# Patient Record
Sex: Female | Born: 1960 | Race: White | Hispanic: No | Smoking: Former smoker
Health system: Southern US, Community
[De-identification: ages and names within clinical notes are randomized; demographics above are authoritative.]

## PROBLEM LIST (undated history)

## (undated) DIAGNOSIS — I10 Essential (primary) hypertension: Secondary | ICD-10-CM

## (undated) DIAGNOSIS — F419 Anxiety disorder, unspecified: Secondary | ICD-10-CM

## (undated) DIAGNOSIS — G8929 Other chronic pain: Secondary | ICD-10-CM

## (undated) DIAGNOSIS — M549 Dorsalgia, unspecified: Secondary | ICD-10-CM

## (undated) DIAGNOSIS — M1990 Unspecified osteoarthritis, unspecified site: Secondary | ICD-10-CM

## (undated) HISTORY — DX: Essential (primary) hypertension: I10

## (undated) HISTORY — DX: Anxiety disorder, unspecified: F41.9

## (undated) HISTORY — PX: TONSILLECTOMY: SUR1361

---

## 2000-01-20 HISTORY — PX: CARPAL TUNNEL RELEASE: SHX101

## 2000-05-28 ENCOUNTER — Encounter: Payer: Self-pay | Admitting: Internal Medicine

## 2000-05-28 ENCOUNTER — Ambulatory Visit (HOSPITAL_COMMUNITY): Admission: RE | Admit: 2000-05-28 | Discharge: 2000-05-28 | Payer: Self-pay | Admitting: Internal Medicine

## 2000-06-03 ENCOUNTER — Encounter: Payer: Self-pay | Admitting: Neurosurgery

## 2000-06-03 ENCOUNTER — Encounter: Admission: RE | Admit: 2000-06-03 | Discharge: 2000-06-03 | Payer: Self-pay | Admitting: Neurosurgery

## 2000-06-17 ENCOUNTER — Encounter: Admission: RE | Admit: 2000-06-17 | Discharge: 2000-06-17 | Payer: Self-pay | Admitting: Neurosurgery

## 2000-06-17 ENCOUNTER — Encounter: Payer: Self-pay | Admitting: Neurosurgery

## 2000-07-01 ENCOUNTER — Encounter: Admission: RE | Admit: 2000-07-01 | Discharge: 2000-07-01 | Payer: Self-pay | Admitting: Neurosurgery

## 2000-07-01 ENCOUNTER — Encounter: Payer: Self-pay | Admitting: Neurosurgery

## 2000-07-20 ENCOUNTER — Ambulatory Visit (HOSPITAL_COMMUNITY): Admission: RE | Admit: 2000-07-20 | Discharge: 2000-07-20 | Payer: Self-pay | Admitting: Neurosurgery

## 2000-09-17 ENCOUNTER — Ambulatory Visit (HOSPITAL_COMMUNITY): Admission: RE | Admit: 2000-09-17 | Discharge: 2000-09-17 | Payer: Self-pay | Admitting: Neurosurgery

## 2001-05-27 ENCOUNTER — Emergency Department (HOSPITAL_COMMUNITY): Admission: EM | Admit: 2001-05-27 | Discharge: 2001-05-27 | Payer: Self-pay | Admitting: *Deleted

## 2001-05-27 ENCOUNTER — Encounter: Payer: Self-pay | Admitting: *Deleted

## 2003-01-10 ENCOUNTER — Ambulatory Visit (HOSPITAL_COMMUNITY): Admission: RE | Admit: 2003-01-10 | Discharge: 2003-01-10 | Payer: Self-pay | Admitting: Internal Medicine

## 2003-06-12 ENCOUNTER — Ambulatory Visit (HOSPITAL_COMMUNITY): Admission: RE | Admit: 2003-06-12 | Discharge: 2003-06-12 | Payer: Self-pay | Admitting: Internal Medicine

## 2004-02-06 ENCOUNTER — Ambulatory Visit (HOSPITAL_COMMUNITY): Admission: RE | Admit: 2004-02-06 | Discharge: 2004-02-06 | Payer: Self-pay | Admitting: Internal Medicine

## 2005-02-03 ENCOUNTER — Ambulatory Visit (HOSPITAL_COMMUNITY): Admission: RE | Admit: 2005-02-03 | Discharge: 2005-02-03 | Payer: Self-pay | Admitting: Internal Medicine

## 2005-06-10 ENCOUNTER — Emergency Department (HOSPITAL_COMMUNITY): Admission: EM | Admit: 2005-06-10 | Discharge: 2005-06-10 | Payer: Self-pay | Admitting: Emergency Medicine

## 2006-01-19 HISTORY — PX: BACK SURGERY: SHX140

## 2006-02-05 ENCOUNTER — Ambulatory Visit (HOSPITAL_COMMUNITY): Admission: RE | Admit: 2006-02-05 | Discharge: 2006-02-05 | Payer: Self-pay | Admitting: Internal Medicine

## 2006-05-18 ENCOUNTER — Emergency Department (HOSPITAL_COMMUNITY): Admission: EM | Admit: 2006-05-18 | Discharge: 2006-05-18 | Payer: Self-pay | Admitting: Emergency Medicine

## 2006-05-20 ENCOUNTER — Ambulatory Visit (HOSPITAL_COMMUNITY): Admission: RE | Admit: 2006-05-20 | Discharge: 2006-05-20 | Payer: Self-pay | Admitting: Emergency Medicine

## 2006-09-08 ENCOUNTER — Emergency Department (HOSPITAL_COMMUNITY): Admission: EM | Admit: 2006-09-08 | Discharge: 2006-09-08 | Payer: Self-pay | Admitting: Emergency Medicine

## 2006-11-11 ENCOUNTER — Ambulatory Visit (HOSPITAL_COMMUNITY): Admission: RE | Admit: 2006-11-11 | Discharge: 2006-11-11 | Payer: Self-pay | Admitting: Internal Medicine

## 2006-11-24 ENCOUNTER — Inpatient Hospital Stay (HOSPITAL_COMMUNITY): Admission: RE | Admit: 2006-11-24 | Discharge: 2006-11-27 | Payer: Self-pay | Admitting: Neurosurgery

## 2006-12-20 ENCOUNTER — Encounter (HOSPITAL_COMMUNITY): Admission: RE | Admit: 2006-12-20 | Discharge: 2007-01-19 | Payer: Self-pay | Admitting: Neurosurgery

## 2006-12-30 ENCOUNTER — Encounter: Admission: RE | Admit: 2006-12-30 | Discharge: 2006-12-30 | Payer: Self-pay | Admitting: Neurosurgery

## 2007-01-21 ENCOUNTER — Encounter (HOSPITAL_COMMUNITY): Admission: RE | Admit: 2007-01-21 | Discharge: 2007-02-20 | Payer: Self-pay | Admitting: Neurosurgery

## 2007-03-01 ENCOUNTER — Encounter: Admission: RE | Admit: 2007-03-01 | Discharge: 2007-03-01 | Payer: Self-pay | Admitting: Neurosurgery

## 2007-03-30 ENCOUNTER — Ambulatory Visit (HOSPITAL_COMMUNITY): Admission: RE | Admit: 2007-03-30 | Discharge: 2007-03-30 | Payer: Self-pay | Admitting: Obstetrics & Gynecology

## 2007-04-14 ENCOUNTER — Other Ambulatory Visit: Admission: RE | Admit: 2007-04-14 | Discharge: 2007-04-14 | Payer: Self-pay | Admitting: Obstetrics & Gynecology

## 2007-07-19 ENCOUNTER — Ambulatory Visit (HOSPITAL_COMMUNITY): Admission: RE | Admit: 2007-07-19 | Discharge: 2007-07-19 | Payer: Self-pay | Admitting: Internal Medicine

## 2007-08-05 ENCOUNTER — Encounter (INDEPENDENT_AMBULATORY_CARE_PROVIDER_SITE_OTHER): Payer: Self-pay | Admitting: General Surgery

## 2007-08-05 ENCOUNTER — Ambulatory Visit (HOSPITAL_COMMUNITY): Admission: RE | Admit: 2007-08-05 | Discharge: 2007-08-05 | Payer: Self-pay | Admitting: General Surgery

## 2007-09-01 ENCOUNTER — Encounter: Admission: RE | Admit: 2007-09-01 | Discharge: 2007-09-01 | Payer: Self-pay | Admitting: Neurosurgery

## 2007-12-01 ENCOUNTER — Encounter: Admission: RE | Admit: 2007-12-01 | Discharge: 2007-12-01 | Payer: Self-pay | Admitting: Neurosurgery

## 2008-02-21 ENCOUNTER — Encounter: Admission: RE | Admit: 2008-02-21 | Discharge: 2008-02-21 | Payer: Self-pay | Admitting: Neurosurgery

## 2008-02-28 ENCOUNTER — Encounter: Admission: RE | Admit: 2008-02-28 | Discharge: 2008-02-28 | Payer: Self-pay | Admitting: Neurosurgery

## 2008-03-09 ENCOUNTER — Encounter: Admission: RE | Admit: 2008-03-09 | Discharge: 2008-03-09 | Payer: Self-pay | Admitting: Neurosurgery

## 2008-11-10 IMAGING — CR DG LUMBAR SPINE 1V
1 series · 1 of 1 positions shown · non-contrast
Comparison: GI [REDACTED] lumbar spine radiograph 12/30/06.

CLINICAL DATA: Low back pain post PLIF L3 through L5.  
 LUMBAR SPINE ONE VIEW:

[t l-spine lat]
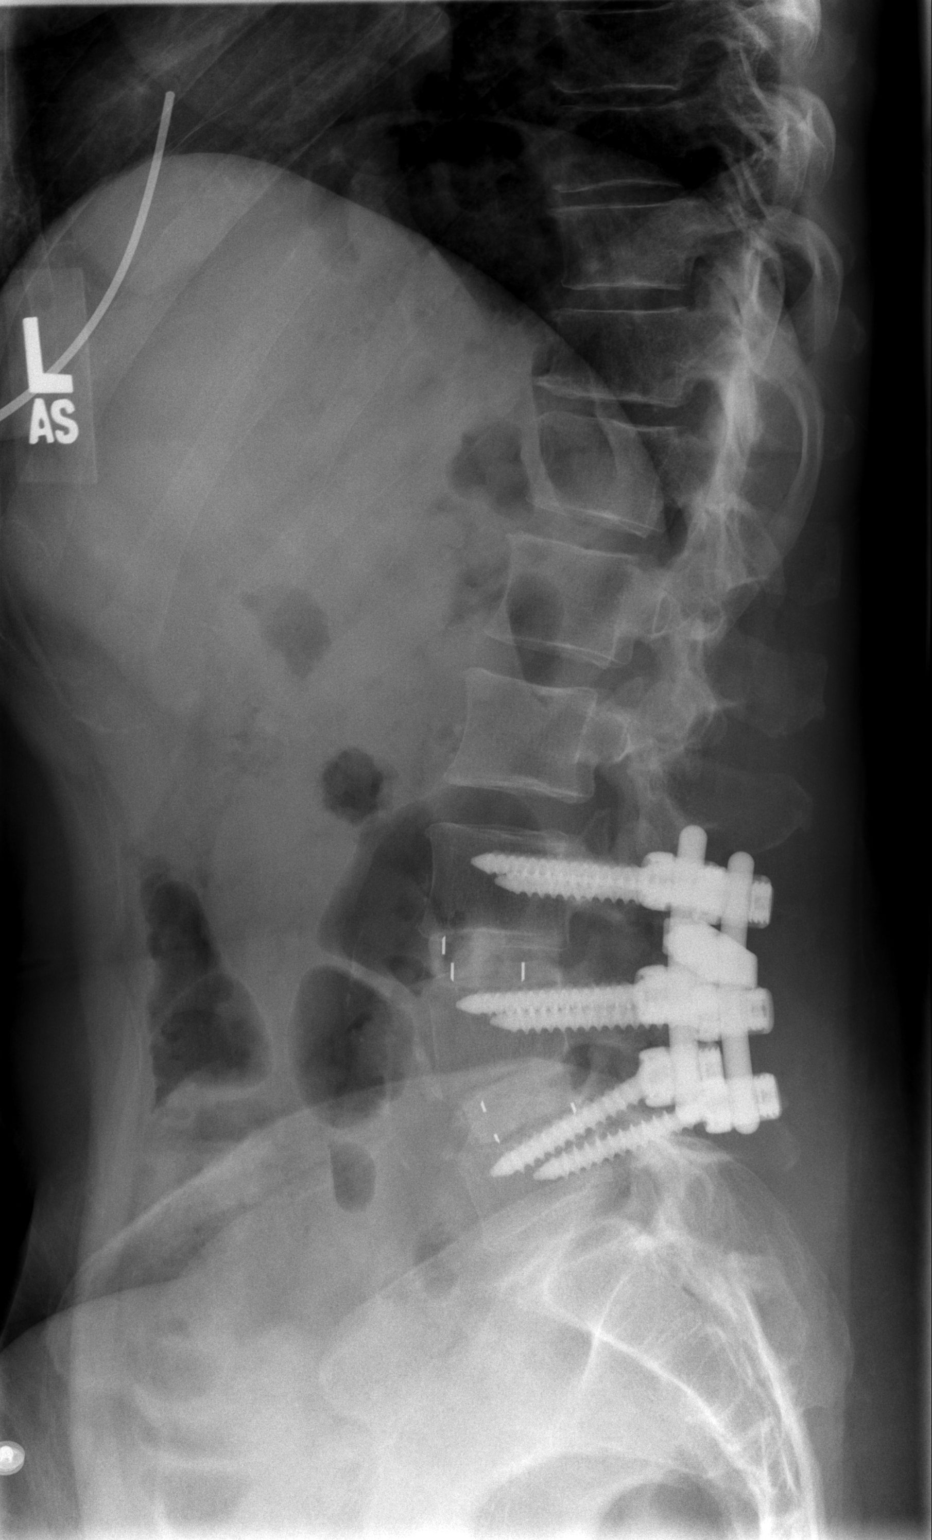

[1 of 1 positions shown; findings below may reference images not displayed]

FINDINGS: Stable satisfactory appearing posterior laminectomy interbody fusion with transpedicular screws from L3 through L5 stable.  Remaining lumbar disc spaces and posterior vertebral alignment are normally maintained.  Unchanged slight atheromatous vascular calcification distal abdominal aorta and bilateral common iliac arteries seen.
IMPRESSION: 1.  No acute findings. 
 2.  Stable satisfactory appearing posterior laminectomy fusion changes L3 through L5.

## 2009-04-16 ENCOUNTER — Other Ambulatory Visit: Admission: RE | Admit: 2009-04-16 | Discharge: 2009-04-16 | Payer: Self-pay | Admitting: Obstetrics & Gynecology

## 2009-04-23 ENCOUNTER — Ambulatory Visit (HOSPITAL_COMMUNITY): Admission: RE | Admit: 2009-04-23 | Discharge: 2009-04-23 | Payer: Self-pay | Admitting: Obstetrics & Gynecology

## 2009-10-11 ENCOUNTER — Ambulatory Visit (HOSPITAL_COMMUNITY): Admission: RE | Admit: 2009-10-11 | Discharge: 2009-10-11 | Payer: Self-pay | Admitting: Neurosurgery

## 2010-02-09 ENCOUNTER — Encounter: Payer: Self-pay | Admitting: Internal Medicine

## 2010-06-03 NOTE — Op Note (Signed)
[name], [name]                 ACCOUNT NO.:  [id number]   MEDICAL RECORD NO.:  [id number]          PATIENT TYPE:  AMB   LOCATION:  DAY                           FACILITY:  [facility]   PHYSICIAN:  Chelsea Primus, MD      DATE OF BIRTH:  January 22, 1960   DATE OF PROCEDURE:  08/05/2007  DATE OF DISCHARGE:                               OPERATIVE REPORT   PREOPERATIVE DIAGNOSIS:  Left posterior arm nodule.   POSTOPERATIVE DIAGNOSIS:  Left posterior arm nodule.   PROCEDURE:  Excision of soft tissue neoplasm of left posterior arm via a  2-cm incision.   SURGEON:  Chelsea Primus, MD   ANESTHESIA:  MAC with local anesthetic.   ESTIMATED BLOOD LOSS:  Minimal.   SPECIMEN:  Left arm subcuticular mass.   INDICATIONS:  The patient is a 50 year old female who presented to my  office with a history of left femoral nodule in the posterior aspect of  her left arm.  Two of these had resolved and one is consistent, and not  significantly changed in size.  She denied any skin changes over this  area, however, this had remained firm and somewhat uncomfortable with  positioning and pressure.  At this point, it was advised that she  undergo an excisional biopsy of this lesion, although the suspicion is  that this is a benign lipomatous lesion.  The risks, benefits, and  alternatives of excision were discussed at length with the patient  including, but not limited to the risk of bleeding, infection, or  recurrence.  The patient's questions and concerns were addressed, and  the patient was consented for the planned procedure.   OPERATION:  The patient was taken to the operating room and placed in  supine position on the operating table, at which time the anesthetic was  administered.  Once the patient was sedated, her left arm was prepped  and draped in the usual fashion.  A vertical incision was created over  the soft tissue mass with a scalpel.  Additional dissection down to the  subcuticular tissue was  carried out using combination of Metzenbaum  scissors dissection as well as electrocautery, and the mass was freed  circumferentially and excised in total, and it was sent as a permanent  specimen to pathology.  Palpation of the wound bed did not demonstrate  any additional thickened tissues or nodularity.  Additional local  anesthetic was instilled.  The wound was irrigated, and a 3-0 Vicryl  suture was utilized to reapproximate the subcuticular tissue, and a 4-0  Monocryl was utilized to reapproximate the skin edges in a running  subcuticular suture.  The skin was washed and dried with moist and dry  towel.  Benzoin was applied over  the incision.  Half-inch Steri-Strips were placed.  The drapes were  removed.  The patient was allowed to come out of sedation.  She was  transferred back to regular hospital bed in stable condition.  At the  conclusion of procedure, all instrument, sponge, and needle counts were  correct.  The patient tolerated the procedure well.  Chelsea Primus, MD  Electronically Signed     BZ/MEDQ  D:  08/06/2007  T:  08/06/2007  Job:  [id number]   cc:   Sherrilee Gilles. Gerarda Fraction, MD  Fax: [phone]

## 2010-06-03 NOTE — Discharge Summary (Signed)
Megan Maldonado, Megan Maldonado                 ACCOUNT NO.:  [id number]   MEDICAL RECORD NO.:  [id number]          PATIENT TYPE:  INP   LOCATION:  3010                         FACILITY:  [facility]   PHYSICIAN:  Kary Kos, M.D.        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   PROCEDURE:  Decompressive laminectomy and posterior lumbar fusion at L3-  L4 and L4-L5.   FINAL DIAGNOSIS:  Decompressive laminectomy and posterior lumbar fusion  at L3-L4 and L4-L5.   HOSPITAL COURSE:  The patient was admitted and he was taken to the  operating room for the aforementioned procedure.  Postop, the patient  did very well and was admitted to the floor.  On the floor, the patient  was significantly improved with improved leg pain and very minimal back  stiffness.  The patient was progressively mobilized with physical and  occupational therapy over the next 24-48 hours.  PCA and Foley were  taken out on day 1.  The patient was stable enough to be discharged home  on hospital day 3 on oral pain medication and muscle relaxers.           ______________________________  Kary Kos, M.D.     GC/MEDQ  D:  12/30/2006  T:  12/30/2006  Job:  [id number]

## 2010-06-03 NOTE — Op Note (Signed)
Megan Maldonado, Megan Maldonado                 ACCOUNT NO.:  [id number]   MEDICAL RECORD NO.:  [id number]          PATIENT TYPE:  INP   LOCATION:  3010                         FACILITY:  [facility]   PHYSICIAN:  Kary Kos, M.D.        DATE OF BIRTH:  03-Sep-1960   DATE OF PROCEDURE:  11/24/2006  DATE OF DISCHARGE:                               OPERATIVE REPORT   PREOPERATIVE DIAGNOSES:  1. Grade 1 spondylolisthesis, L3-4 with severe spinal stenosis,      bilateral L4 radiculopathy.  2. Degenerative disk disease, L4-5 with central disk herniation,      lumbar spinal stenosis and bilateral L5 radiculopathies.   PROCEDURES:  Decompressive laminectomy L3-4 in excess of what would be  needed with a standard interbody fusion, decompressive laminectomy L4-5,  posterior lumbar interbody fusion L3-4 and L4-5 using Hybrid 10 x 22  Telamon PEEK packed with locally harvested autograft mixed with  Progenics bone substitute on one side with a tangent 10 x 26 mm  allograft wedge on the other at both levels.  Pedicle screw fixation, L3  to L5 using the 6.35 Legacy pedicle screw system posterolateral  arthrodesis, L3 to L5 using locally harvested autograft mixed with  Progenics bone substitute, placement of medium Hemovac drain.   SURGEON:  Kary Kos, M.D.   ASSISTANT:  Eustace Moore, MD   ANESTHESIA:  General endotracheal.   HISTORY OF PRESENT ILLNESS:  The patient is a very pleasant 50 year old  female who has had progressively worsening back and bilateral hip and  leg pain getting progressively worse over the last several months.  The  patient has failed all forms of conservative treatment and after  progressively worsening pain with radiation down the backs of her legs  and front of her shins, tops of her feet and big toe, and will  claudicate after very short distances, patient has failed all forms of  conservative treatment.  Preoperative imaging showed grade 1  spondylolisthesis at L3-4, causing severe  spinal stenosis, diastasis of  facet complex at L3-4 as well as severe spinal stenosis at L4-5 with  central disk herniation and facet arthropathy and severe degenerative  disk disease.  Patient was recommended decompressive laminectomies and  fusions at both these levels.  The risks and benefits of the operation  were explained to the patient.  She understands and agreed to proceed  forward.   DESCRIPTION OF PROCEDURE:  The patient was brought to the operating room  where she was induced under general anesthesia.  She was positioned  prone on the Wilson frame, back was prepped and draped in sterile  fashion.  Preoperative x-ray localized the appropriate level.  After  infiltration of 10 mL of lidocaine with epinephrine, midline incision  with Bovie electrocautery was used to take down subcutaneous tissue.  Subperiosteal dissection carried out to lamina of L3, 4 and 5  bilaterally, exposing the transverse processes at L3, 4, and 5  bilaterally.  Intraoperative x-ray confirmed localization at the  appropriate level and then the facet complex was noted to be markedly  hypertrophic and  diastases.  The medial facet complex was drilled down.  Spinous processes at L3 and L4 were removed.  Central decompression was  begun with a 3 mm Kerrison punch.  The thecal sac was noted to be marked  stenotic with hour glass compression of the thecal sac and so gentle  care was taken dissecting the dura off the undersurface of the lamina  with a 4 Penfield and central decompressions were completed with  complete medial facetectomies at 3-4 and 4-5.  Again facet complexes at  L3-4 and 4-5 were noted to be markedly diastased, degenerated and  collapsed.  There was noted to be marked hypertrophic superior aspect of  facets overgrowing on both the L3 and the L4 nerve roots respectively.  After all the facet complexes were completely removed, with removal of  the superior articulating facets at each level.  The  foraminotomies at  L3, L4 and L5 were aggressively performed.  At this point after complete  decompressive laminectomy had been completed and the foramina were  widely patent, skeletonizing both the 3, 4, and 5 roots, attention taken  first to pedicle screw fixation, first using high speed drill pilot  holes were drilled at L3 on the left cannulated with the awl, probed,  tapped with a 5.5 tap, probed again and a 6 x 45 screw inserted on the  left at L5.  Bony landmarks both externally outside the canal as well as  within the canal and fluoroscopy was used to confirm trajectory.  All  pedicles were confirmed and had no medial and lateral breech.  Then the  L4 and L5 screws on the left were inserted in similar fashion with a 6 x  45 at L3 and L4 and a 6 x 40 at L5.  These were repeated on the right  side at L3, 4, and 5 with 6 x 45s at L3 and L4 and 6 x 40 at L5.  After  all six screws had been placed and trajectory confirmed by fluoroscopy,  attention was turned to the interbody work.  Distractors were inserted  at both 3-4 and 4-5 on the right side, 10 distractor at 3-4 and 11  distractor at 4-5.  Then attention was taken to the left side.  Interspace was incised.  The disk space was cleaned out.  Size 10 cutter  was inserted on the left side at L4-5.  The disk space was cleaned out.  Large central disk herniations were removed from the L4-5 with pituitary  rongeurs.  A size 10 chisel was used.  After confirming this to be  appropriate size for graft material and after the end plates were  scraped, a 10 x 22 mm Telamon PEEK cage packed with locally harvested  autograft and allograft was inserted on the left side.  Then the right-  sided distractor was removed and the right-sided interspace was prepared  in similar fashion with a tangent 10 x 26 mm inserted on the patient's  right side.  Then attention taken to 3-4 and in similar fashion the disk  space was cleaned out radically.  Tangent  inserted on the left.  Locally  harvested autograft packed centrally at 3-4 as well as had been done at  4-5 and then on the right side, a Telamon PEEK cage was inserted.  Fluoroscopy confirmed good position of the interbody spacers and then  after all this had been done, the wound was copiously irrigated,  meticulous hemostasis was maintained, aggressive decortication was  carried out at transverse processes and lateral gutters.  The remainder  of the locally harvested autograft pack with Progenics was inserted in  the lateral gutters.  70 mm rods were then placed, top two screws  tightened  down at L5.  The L4 pedicle screws compressed against L5 and  the L3 compressed against L4.  Then a 4 x22 cross link was inserted  between 3 and 4, then the neural foramina were reinspected with a  coronary dilator and angled hockey stick and noted to be widely patent.  Gelfoam was overlaid on top of the dura.  Then a medium Hemovac drain  was placed and wound was closed in layers with interrupted  Vicryl with  Dermabond, Benzoin and Steri-Strips on the skin.  At the end of the  case, needle sponge counts were correct.           ______________________________  Kary Kos, M.D.     GC/MEDQ  D:  11/24/2006  T:  11/25/2006  Job:  [id number]

## 2010-06-06 NOTE — Op Note (Signed)
[facility]  Patient:    Megan Maldonado, Megan Maldonado                        MRN: [id number] Proc. Date: 07/20/00 Adm. Date:  [id number] Disc. Date: [id number] Attending:  Minda Meo                           Operative Report  PREOPERATIVE DIAGNOSIS:  Right carpal tunnel syndrome.  POSTOPERATIVE DIAGNOSIS:  Right carpal tunnel syndrome.  PROCEDURE PERFORMED:  Decompression of the right median nerve.  ANESTHESIA:  Intravenous location plus local.  SURGEON:  Zigmund Daniel. Joya Salm, M.D.  INDICATIONS:  The patient had been complaining of arm pain as well as weakness of the hands.  A neural conduction test was positive for carpal tunnel syndrome.  Surgery was advised.  The patient knew all the risks.  DESCRIPTION OF PROCEDURE:  The patient was taken to the operating room and the right arm was prepped with Betadine.  Infiltration along the base of the thumb was made.  An incision following the base of thumb was made through the skin, volar ligament, and through the carpal ligament.  Decompression proximally was achieved going along the ulnar aspect of the nerve.  Decompression distally was done.  We found that indeed the ligament was thick and the nerve was reddish.  Decompression was achieved.  Hemostasis was done.  At the end of the procedure, the patient was able to the fingers including the thumb. Hemostasis was done with bipolar and the wound was closed with nylon.  The patient tolerated the procedure well. DD:  07/20/00 TD:  07/20/00 Job: [id number]

## 2010-10-17 LAB — BASIC METABOLIC PANEL
BUN: 8
Calcium: 9.8
Creatinine, Ser: 0.59
GFR calc non Af Amer: >60
Glucose, Bld: 93
Potassium: 4.3

## 2010-10-17 LAB — DIFFERENTIAL
Basophils Absolute: 0.1
Eosinophils Relative: 0
Lymphocytes Relative: 32
Lymphs Abs: 2.3
Neutro Abs: 4.4
Neutrophils Relative %: 60

## 2010-10-17 LAB — HCG, SERUM, QUALITATIVE: Preg, Serum: NEGATIVE

## 2010-10-17 LAB — CBC
Platelets: 302
RDW: 13.1
WBC: 7.2

## 2010-10-28 LAB — CBC
HCT: 43
Platelets: 439 — ABNORMAL HIGH
RBC: 4.42
WBC: 10.3

## 2010-10-28 LAB — TYPE AND SCREEN: ABO/RH(D): B NEG

## 2010-10-28 LAB — BASIC METABOLIC PANEL
BUN: 11
Chloride: 106
GFR calc Af Amer: >60
GFR calc non Af Amer: >60
Potassium: 4.3

## 2010-10-28 LAB — ABO/RH: ABO/RH(D): B NEG

## 2011-01-03 IMAGING — MG MM DIGITAL SCREENING
3 series · 3 of 3 positions shown · non-contrast
Comparison: none

DG SCREEN MAMMOGRAM BILATERAL
Bilateral CC and MLO view(s) were taken.

DIGITAL SCREENING MAMMOGRAM WITH CAD:
There are scattered fibroglandular densities.  No masses or malignant type calcifications are 
identified.  Compared with prior studies.
Images were processed with CAD.

[L CC]
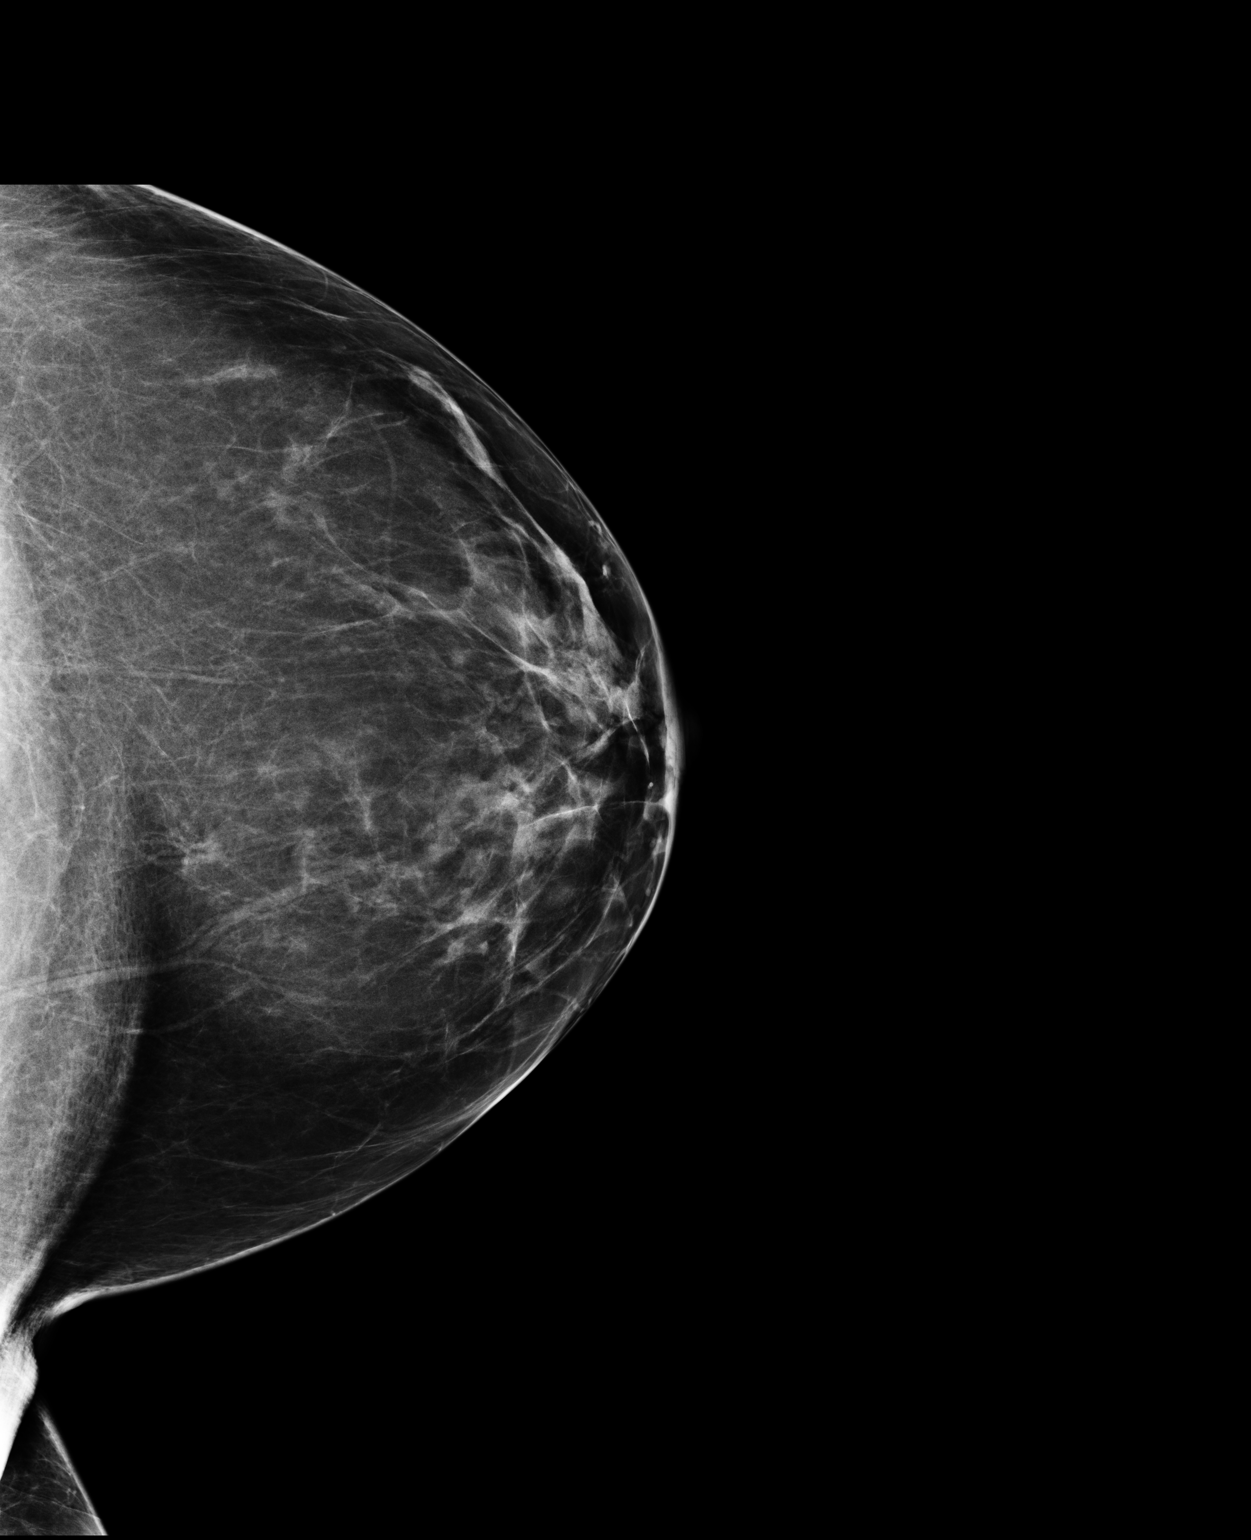

[R CC]
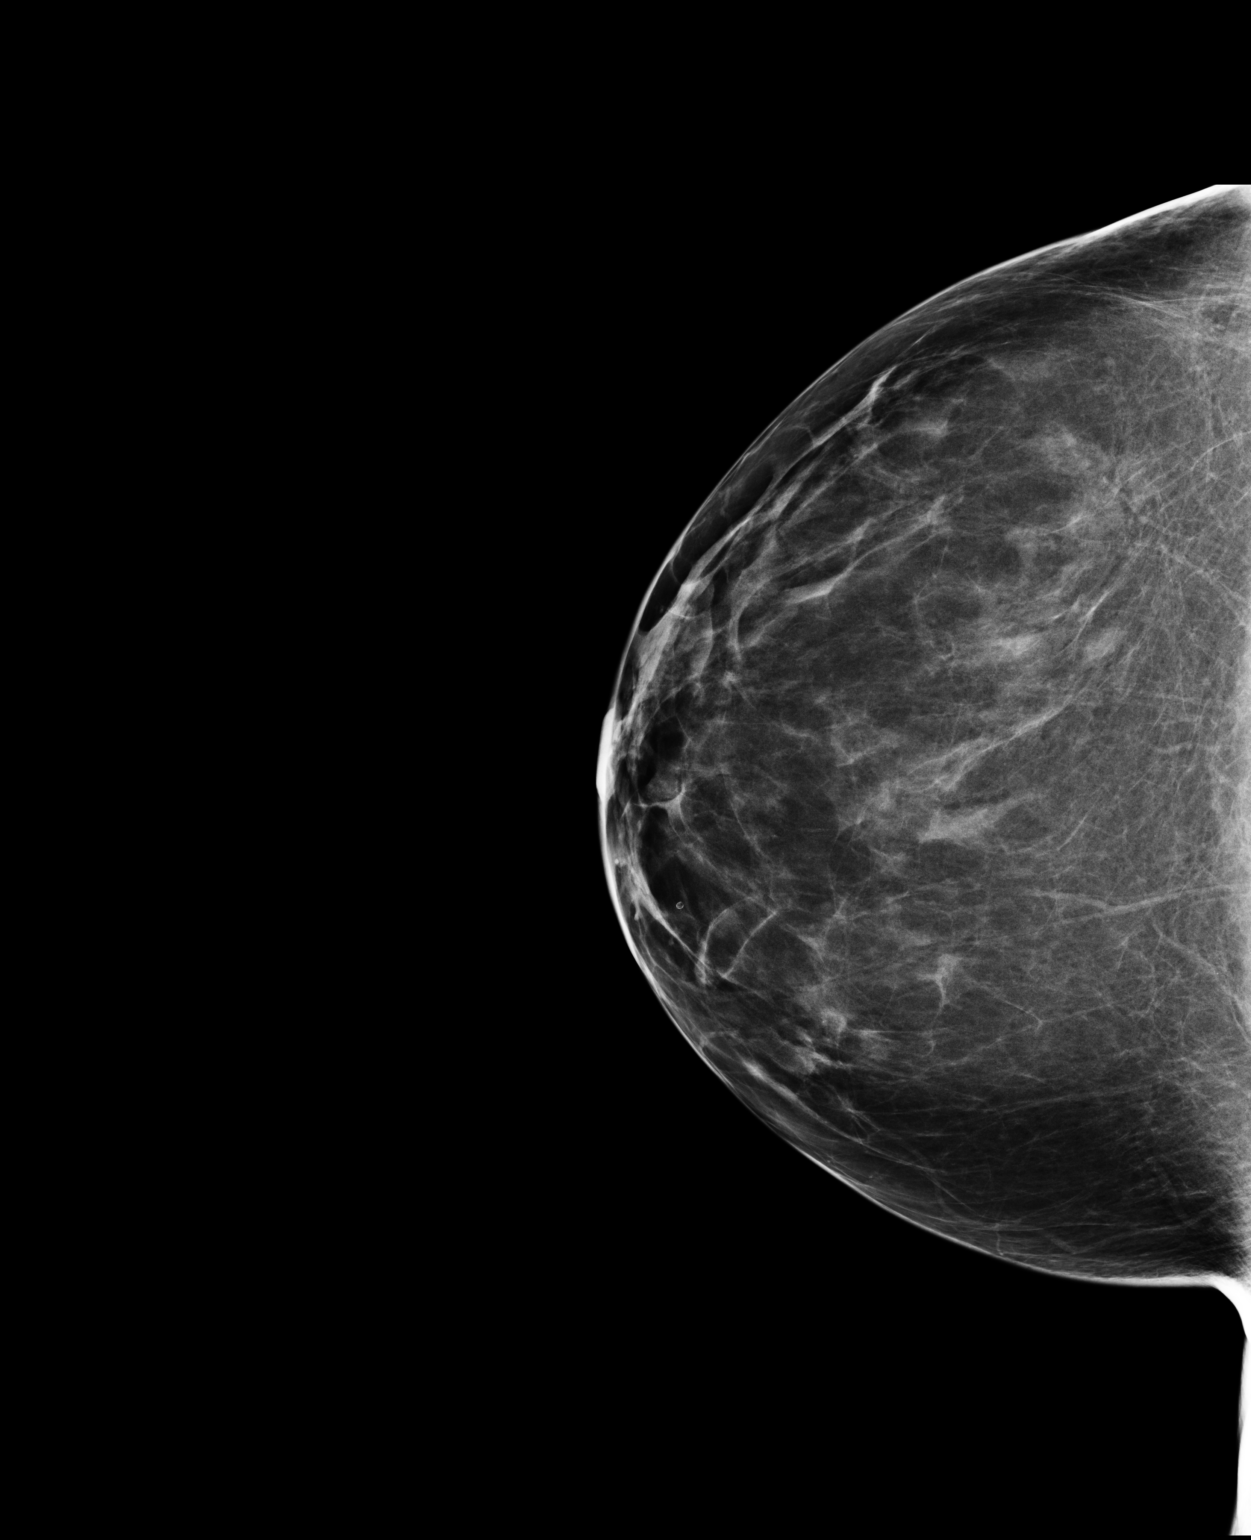

[R MLO]
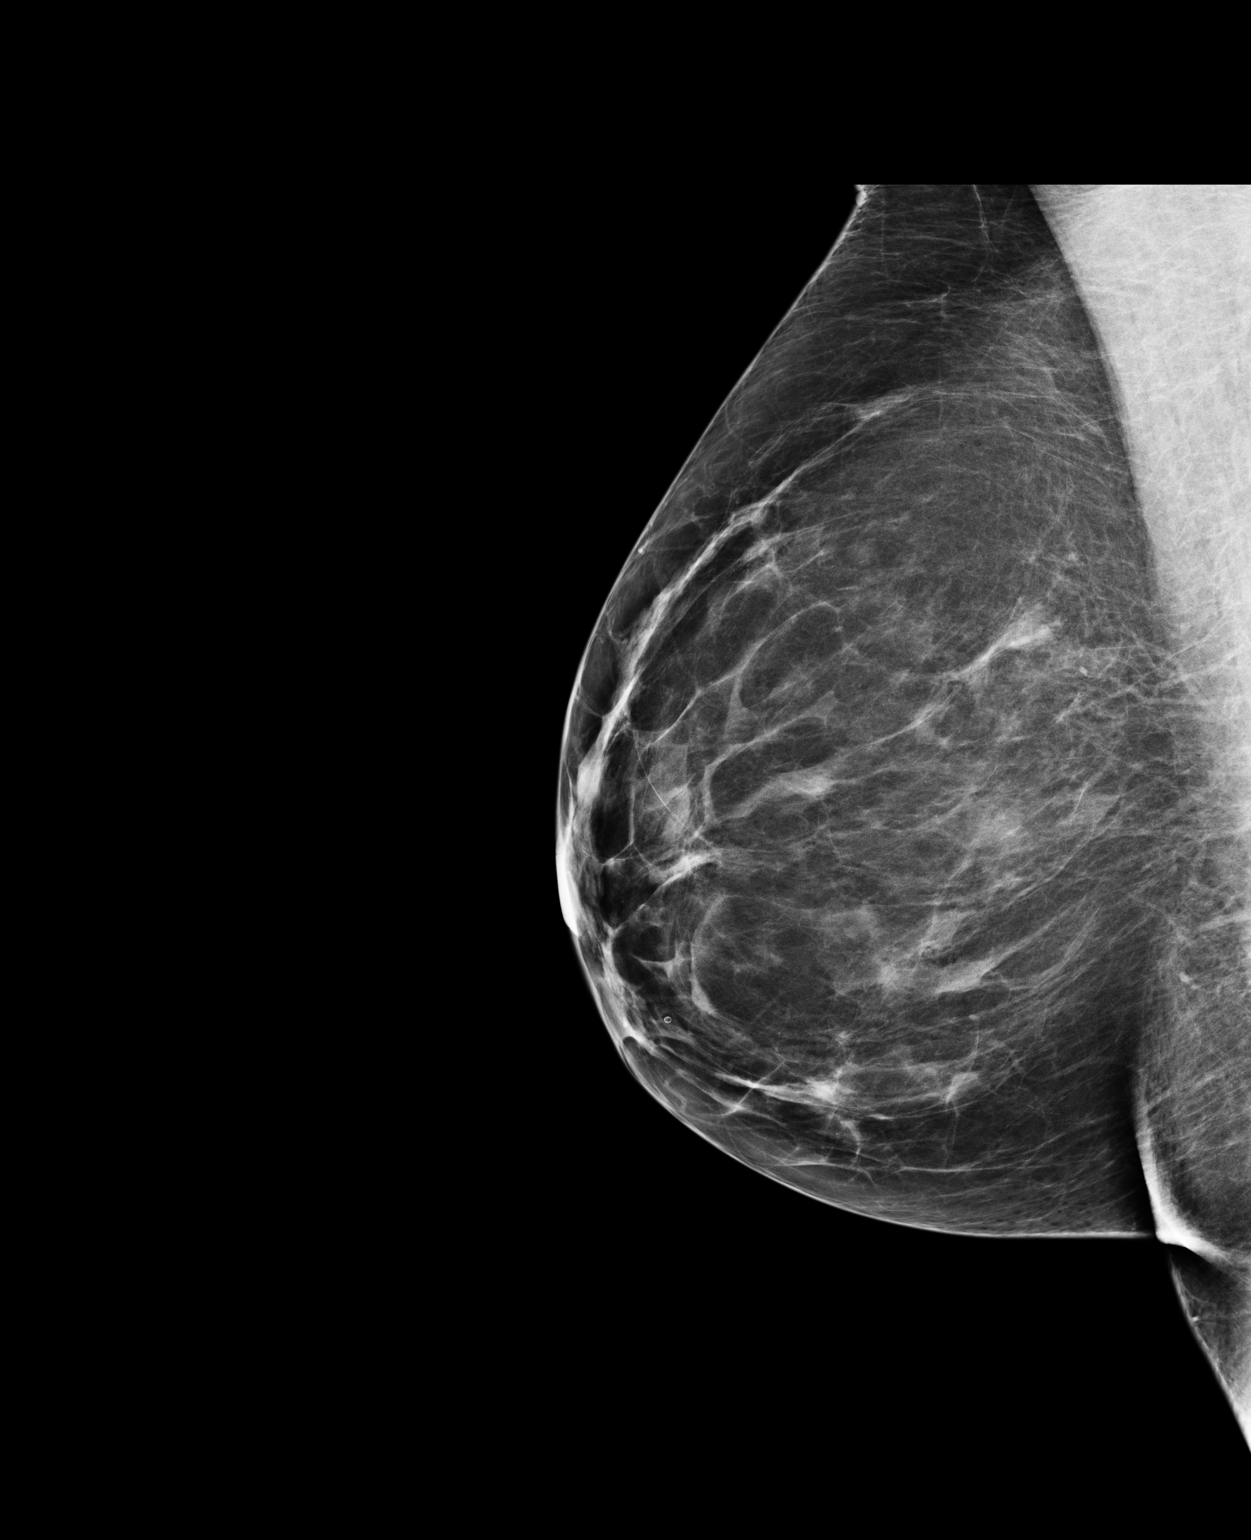

[3 of 3 positions shown; findings below may reference images not displayed]

IMPRESSION: No specific mammographic evidence of malignancy.  Next screening mammogram is recommended in one 
year.

A result letter of this screening mammogram will be mailed directly to the patient.

ASSESSMENT: Negative - BI-RADS 1

Screening mammogram in 1 year.
,

## 2011-06-09 ENCOUNTER — Emergency Department (HOSPITAL_COMMUNITY)
Admission: EM | Admit: 2011-06-09 | Discharge: 2011-06-09 | Disposition: A | Discharge status: Home / Self Care | Payer: 59 | Attending: Emergency Medicine | Admitting: Emergency Medicine

## 2011-06-09 ENCOUNTER — Encounter (HOSPITAL_COMMUNITY): Payer: Self-pay

## 2011-06-09 DIAGNOSIS — Y93H2 Activity, gardening and landscaping: Secondary | ICD-10-CM | POA: Insufficient documentation

## 2011-06-09 DIAGNOSIS — W57XXXA Bitten or stung by nonvenomous insect and other nonvenomous arthropods, initial encounter: Secondary | ICD-10-CM | POA: Insufficient documentation

## 2011-06-09 DIAGNOSIS — Y92009 Unspecified place in unspecified non-institutional (private) residence as the place of occurrence of the external cause: Secondary | ICD-10-CM | POA: Insufficient documentation

## 2011-06-09 DIAGNOSIS — Y998 Other external cause status: Secondary | ICD-10-CM | POA: Insufficient documentation

## 2011-06-09 DIAGNOSIS — S90569A Insect bite (nonvenomous), unspecified ankle, initial encounter: Secondary | ICD-10-CM | POA: Insufficient documentation

## 2011-06-09 HISTORY — DX: Dorsalgia, unspecified: M54.9

## 2011-06-09 HISTORY — DX: Other chronic pain: G89.29

## 2011-06-09 MED ORDER — FEXOFENADINE HCL 180 MG PO TABS: 180.0000 mg | ORAL_TABLET | Freq: Every day | ORAL | Status: DC

## 2011-06-09 MED ORDER — LORATADINE 10 MG PO TABS
10.0000 mg | ORAL_TABLET | Freq: Once | ORAL | Status: AC
  Administered 2011-06-09: 10 mg via ORAL
  Filled 2011-06-09 (×2): qty 1

## 2011-06-09 MED ORDER — PREDNISONE 20 MG PO TABS
60.0000 mg | ORAL_TABLET | Freq: Once | ORAL | Status: AC
  Administered 2011-06-09: 60 mg via ORAL
  Filled 2011-06-09: qty 3

## 2011-06-09 MED ORDER — SULFAMETHOXAZOLE-TMP DS 800-160 MG PO TABS
1.0000 | ORAL_TABLET | Freq: Once | ORAL | Status: AC
  Administered 2011-06-09: 1 via ORAL
  Filled 2011-06-09: qty 1

## 2011-06-09 MED ORDER — SULFAMETHOXAZOLE-TRIMETHOPRIM 800-160 MG PO TABS: 1.0000 | ORAL_TABLET | Freq: Two times a day (BID) | ORAL | Status: AC

## 2011-06-09 MED ORDER — HYDROXYZINE PAMOATE 25 MG PO CAPS: ORAL_CAPSULE | ORAL | Status: DC

## 2011-06-09 NOTE — ED Notes (Signed)
Was working in yard Friday and thinks she may have gotten into chiggers, multiple small bites to legs, one area has large bruise and is tender.  (on left lower leg)

## 2011-06-09 NOTE — ED Notes (Signed)
Multiple insect bites to legs, Area on lt leg has area of redness and contusion.  Insect bites occurred on Friday.

## 2011-06-09 NOTE — ED Provider Notes (Signed)
Medical screening examination/treatment/procedure(s) were performed by non-physician practitioner and as supervising physician I was immediately available for consultation/collaboration. Rolland Porter, MD, FACEP   Janice Norrie, MD 06/09/11 [phone]

## 2011-06-09 NOTE — Discharge Instructions (Signed)
Insect Bite Mosquitoes, flies, fleas, bedbugs, and other insects can bite. Insect bites are different from insect stings. The bite may be red, puffy (swollen), and itchy for 2 to 4 days. Most bites get better on their own. HOME CARE   Do not scratch the bite.   Keep the bite clean and dry. Wash the bite with soap and water.   Put ice on the bite.   Put ice in a plastic bag.   Place a towel between your skin and the bag.   Leave the ice on for 20 minutes, 4 times a day. Do this for the first 2 to 3 days, or as told by your doctor.   You may use medicated lotions or creams to lessen itching as told by your doctor.   Only take medicines as told by your doctor.   If you are given medicines (antibiotics), take them as told. Finish them even if you start to feel better.  You may need a tetanus shot if:  You cannot remember when you had your last tetanus shot.   You have never had a tetanus shot.   The injury broke your skin.  If you need a tetanus shot and you choose not to have one, you may get tetanus. Sickness from tetanus can be serious. GET HELP RIGHT AWAY IF:   You have more pain, redness, or puffiness.   You see a red line on the skin coming from the bite.   You have a fever.   You have joint pain.   You have a headache or neck pain.   You feel weak.   You have a rash.   You have chest pain, or you are short of breath.   You have belly (abdominal) pain.   You feel sick to your stomach (nauseous) or throw up (vomit).   You feel very tired or sleepy.  MAKE SURE YOU:   Understand these instructions.   Will watch your condition.   Will get help right away if you are not doing well or get worse.  Document Released: 01/03/2000 Document Revised: 12/25/2010 Document Reviewed: 08/06/2010 [facility] Patient Information 2012 [place].

## 2011-06-09 NOTE — ED Provider Notes (Signed)
History     CSN: [id number]  Arrival date & time 06/09/11  1617   First MD Initiated Contact with Patient 06/09/11 1820      Chief Complaint  Patient presents with  . Insect Bite    (Consider location/radiation/quality/duration/timing/severity/associated sxs/prior treatment) HPI Comments: Patient states that on May 17. She was doing some weeding in the back of her yard, when she thinks she may have gotten into" a nest of chiggers". She put medication on the areas of her legs, but the next day noted that one of the areas was approximately the size of a quarter, red, raised, and itched more than the rest of the areas. She became concerned that this may be an infection, and presents to the emergency department for additional evaluation. She has not had fever, she's not had chills , and there's been no vomiting. She has not tried any other topical or oral medications.  The history is provided by the patient.    Past Medical History  Diagnosis Date  . Chronic back pain     Past Surgical History  Procedure Date  . Back surgery   . Tonsillectomy   . Carpal tunnel release     No family history on file.  History  Substance Use Topics  . Smoking status: Current Everyday Smoker    Types: Cigarettes  . Smokeless tobacco: Not on file  . Alcohol Use: No    OB History    Grav Para Term Preterm Abortions TAB SAB Ect Mult Living                  Review of Systems  Constitutional: Negative for activity change.       All ROS Neg except as noted in HPI  HENT: Negative for nosebleeds and neck pain.   Eyes: Negative for photophobia and discharge.  Respiratory: Negative for cough, shortness of breath and wheezing.   Cardiovascular: Negative for chest pain and palpitations.  Gastrointestinal: Negative for abdominal pain and blood in stool.  Genitourinary: Negative for dysuria, frequency and hematuria.  Musculoskeletal: Positive for back pain. Negative for arthralgias.  Skin:  Positive for rash.  Neurological: Negative for dizziness, seizures and speech difficulty.  Psychiatric/Behavioral: Negative for hallucinations and confusion.    Allergies  Other  Home Medications   Current Outpatient Rx  Name Route Sig Dispense Refill  . ACETAMINOPHEN 500 MG PO TABS Oral Take 500-1,000 mg by mouth daily as needed. For pain    . DIAZEPAM 5 MG PO TABS Oral Take 5 mg by mouth daily as needed.    Marland Kitchen HYDROCODONE-ACETAMINOPHEN 5-500 MG PO TABS Oral Take 1 tablet by mouth daily as needed.      BP 152/89  Pulse 109  Temp(Src) 98.6 F (37 C) (Oral)  Resp 20  Ht 5' 2"  (1.575 m)  Wt 176 lb (79.833 kg)  BMI 32.19 kg/m2  SpO2 98%  Physical Exam  Nursing note and vitals reviewed. Constitutional: She is oriented to person, place, and time. She appears well-developed and well-nourished.  Non-toxic appearance.  HENT:  Head: Normocephalic.  Right Ear: Tympanic membrane and external ear normal.  Left Ear: Tympanic membrane and external ear normal.  Eyes: EOM and lids are normal. Pupils are equal, round, and reactive to light.  Neck: Normal range of motion. Neck supple. Carotid bruit is not present.  Cardiovascular: Normal rate, regular rhythm, normal heart sounds, intact distal pulses and normal pulses.   Pulmonary/Chest: Breath sounds normal. No respiratory distress.  Abdominal: Soft. Bowel sounds are normal. There is no tenderness. There is no guarding.  Musculoskeletal: Normal range of motion.       Several small raised areas of the lower legs. 1 quarter size. Area of the lateral portion of the left leg. No red streaking. Present. No drainage present. The area is not hot. Distal pulses are symmetrical.  Lymphadenopathy:       Head (right side): No submandibular adenopathy present.       Head (left side): No submandibular adenopathy present.    She has no cervical adenopathy.  Neurological: She is alert and oriented to person, place, and time. She has normal strength. No  cranial nerve deficit or sensory deficit.  Skin: Skin is warm and dry.  Psychiatric: She has a normal mood and affect. Her speech is normal.    ED Course  Procedures (including critical care time)  Labs Reviewed - No data to display No results found.   No diagnosis found.    MDM  I have reviewed nursing notes, vital signs, and all appropriate lab and imaging results for this patient. Patient will be treated with Bactrim 2 times daily with food, Allegra each morning. For itching, Vistaril. Each evening. For itching.       Lenox Ahr, [place] 06/09/11 1835

## 2011-09-01 ENCOUNTER — Other Ambulatory Visit (HOSPITAL_COMMUNITY): Payer: Self-pay | Admitting: Internal Medicine

## 2011-09-01 DIAGNOSIS — Z139 Encounter for screening, unspecified: Secondary | ICD-10-CM

## 2011-09-04 ENCOUNTER — Ambulatory Visit (HOSPITAL_COMMUNITY): Payer: 59

## 2011-09-07 ENCOUNTER — Ambulatory Visit (HOSPITAL_COMMUNITY)
Admission: RE | Admit: 2011-09-07 | Discharge: 2011-09-07 | Disposition: A | Discharge status: Home / Self Care | Payer: 59 | Source: Ambulatory Visit | Attending: Internal Medicine | Admitting: Internal Medicine

## 2011-09-07 DIAGNOSIS — Z139 Encounter for screening, unspecified: Secondary | ICD-10-CM

## 2011-09-07 DIAGNOSIS — Z1231 Encounter for screening mammogram for malignant neoplasm of breast: Secondary | ICD-10-CM | POA: Insufficient documentation

## 2012-06-23 ENCOUNTER — Encounter (HOSPITAL_COMMUNITY): Payer: 59 | Admitting: Physical Therapy

## 2012-06-27 ENCOUNTER — Encounter (HOSPITAL_COMMUNITY): Payer: 59 | Admitting: Physical Therapy

## 2012-07-05 ENCOUNTER — Ambulatory Visit (HOSPITAL_COMMUNITY)
Admission: RE | Admit: 2012-07-05 | Discharge: 2012-07-05 | Disposition: A | Discharge status: Home / Self Care | Payer: BC Managed Care – PPO | Source: Ambulatory Visit | Attending: Internal Medicine | Admitting: Internal Medicine

## 2012-07-05 DIAGNOSIS — M545 Low back pain, unspecified: Secondary | ICD-10-CM | POA: Insufficient documentation

## 2012-07-05 DIAGNOSIS — IMO0001 Reserved for inherently not codable concepts without codable children: Secondary | ICD-10-CM | POA: Insufficient documentation

## 2013-08-02 ENCOUNTER — Other Ambulatory Visit (HOSPITAL_COMMUNITY): Payer: Self-pay | Admitting: Internal Medicine

## 2013-08-02 DIAGNOSIS — K769 Liver disease, unspecified: Secondary | ICD-10-CM

## 2013-08-02 DIAGNOSIS — R740 Nonspecific elevation of levels of transaminase and lactic acid dehydrogenase [LDH]: Secondary | ICD-10-CM

## 2013-08-02 DIAGNOSIS — R7401 Elevation of levels of liver transaminase levels: Secondary | ICD-10-CM

## 2013-08-02 DIAGNOSIS — R748 Abnormal levels of other serum enzymes: Secondary | ICD-10-CM

## 2013-08-08 ENCOUNTER — Ambulatory Visit (HOSPITAL_COMMUNITY)
Admission: RE | Admit: 2013-08-08 | Discharge: 2013-08-08 | Disposition: A | Discharge status: Home / Self Care | Payer: BC Managed Care – PPO | Source: Ambulatory Visit | Attending: Internal Medicine | Admitting: Internal Medicine

## 2013-08-08 DIAGNOSIS — R7402 Elevation of levels of lactic acid dehydrogenase (LDH): Secondary | ICD-10-CM | POA: Insufficient documentation

## 2013-08-08 DIAGNOSIS — R7401 Elevation of levels of liver transaminase levels: Secondary | ICD-10-CM | POA: Insufficient documentation

## 2013-08-08 DIAGNOSIS — R748 Abnormal levels of other serum enzymes: Secondary | ICD-10-CM

## 2013-08-08 DIAGNOSIS — K769 Liver disease, unspecified: Secondary | ICD-10-CM | POA: Insufficient documentation

## 2013-08-08 DIAGNOSIS — R740 Nonspecific elevation of levels of transaminase and lactic acid dehydrogenase [LDH]: Secondary | ICD-10-CM

## 2013-08-17 ENCOUNTER — Encounter (INDEPENDENT_AMBULATORY_CARE_PROVIDER_SITE_OTHER): Payer: Self-pay | Admitting: *Deleted

## 2013-08-30 ENCOUNTER — Ambulatory Visit (INDEPENDENT_AMBULATORY_CARE_PROVIDER_SITE_OTHER): Payer: BC Managed Care – PPO | Admitting: Internal Medicine

## 2013-09-20 ENCOUNTER — Other Ambulatory Visit (HOSPITAL_COMMUNITY): Payer: Self-pay | Admitting: Internal Medicine

## 2013-09-20 DIAGNOSIS — M549 Dorsalgia, unspecified: Secondary | ICD-10-CM

## 2013-09-22 ENCOUNTER — Ambulatory Visit (HOSPITAL_COMMUNITY)
Admission: RE | Admit: 2013-09-22 | Discharge: 2013-09-22 | Disposition: A | Discharge status: Home / Self Care | Payer: BC Managed Care – PPO | Source: Ambulatory Visit | Attending: Internal Medicine | Admitting: Internal Medicine

## 2013-09-22 DIAGNOSIS — R209 Unspecified disturbances of skin sensation: Secondary | ICD-10-CM | POA: Diagnosis not present

## 2013-09-22 DIAGNOSIS — M51379 Other intervertebral disc degeneration, lumbosacral region without mention of lumbar back pain or lower extremity pain: Secondary | ICD-10-CM | POA: Insufficient documentation

## 2013-09-22 DIAGNOSIS — M549 Dorsalgia, unspecified: Secondary | ICD-10-CM

## 2013-09-22 DIAGNOSIS — M545 Low back pain, unspecified: Secondary | ICD-10-CM | POA: Diagnosis present

## 2013-09-22 DIAGNOSIS — M949 Disorder of cartilage, unspecified: Secondary | ICD-10-CM | POA: Diagnosis not present

## 2013-09-22 DIAGNOSIS — M899 Disorder of bone, unspecified: Secondary | ICD-10-CM | POA: Diagnosis not present

## 2013-09-22 DIAGNOSIS — M48061 Spinal stenosis, lumbar region without neurogenic claudication: Secondary | ICD-10-CM | POA: Insufficient documentation

## 2013-09-22 DIAGNOSIS — M5137 Other intervertebral disc degeneration, lumbosacral region: Secondary | ICD-10-CM | POA: Insufficient documentation

## 2013-11-14 DIAGNOSIS — M48061 Spinal stenosis, lumbar region without neurogenic claudication: Secondary | ICD-10-CM | POA: Insufficient documentation

## 2014-10-26 ENCOUNTER — Encounter (HOSPITAL_COMMUNITY): Payer: Self-pay | Admitting: Emergency Medicine

## 2014-10-26 ENCOUNTER — Emergency Department (HOSPITAL_COMMUNITY)
Admission: EM | Admit: 2014-10-26 | Discharge: 2014-10-26 | Disposition: A | Discharge status: Home / Self Care | Payer: BLUE CROSS/BLUE SHIELD | Attending: Emergency Medicine | Admitting: Emergency Medicine

## 2014-10-26 ENCOUNTER — Emergency Department (HOSPITAL_COMMUNITY): Payer: BLUE CROSS/BLUE SHIELD

## 2014-10-26 DIAGNOSIS — Z79899 Other long term (current) drug therapy: Secondary | ICD-10-CM | POA: Insufficient documentation

## 2014-10-26 DIAGNOSIS — G8929 Other chronic pain: Secondary | ICD-10-CM | POA: Diagnosis not present

## 2014-10-26 DIAGNOSIS — M1990 Unspecified osteoarthritis, unspecified site: Secondary | ICD-10-CM | POA: Insufficient documentation

## 2014-10-26 DIAGNOSIS — M25571 Pain in right ankle and joints of right foot: Secondary | ICD-10-CM | POA: Diagnosis present

## 2014-10-26 DIAGNOSIS — Z87828 Personal history of other (healed) physical injury and trauma: Secondary | ICD-10-CM | POA: Diagnosis not present

## 2014-10-26 DIAGNOSIS — Z720 Tobacco use: Secondary | ICD-10-CM | POA: Insufficient documentation

## 2014-10-26 DIAGNOSIS — M79671 Pain in right foot: Secondary | ICD-10-CM

## 2014-10-26 HISTORY — DX: Unspecified osteoarthritis, unspecified site: M19.90

## 2014-10-26 MED ORDER — DICLOFENAC SODIUM 75 MG PO TBEC: 75.0000 mg | DELAYED_RELEASE_TABLET | Freq: Two times a day (BID) | ORAL | Status: DC

## 2014-10-26 NOTE — ED Notes (Signed)
ASO applied. CSM intact.
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
## 2014-11-06 ENCOUNTER — Ambulatory Visit (INDEPENDENT_AMBULATORY_CARE_PROVIDER_SITE_OTHER): Payer: BLUE CROSS/BLUE SHIELD | Admitting: Orthopedic Surgery

## 2014-11-06 VITALS — BP 131/85 | Ht 62.0 in | Wt 200.0 lb

## 2014-11-06 DIAGNOSIS — M129 Arthropathy, unspecified: Secondary | ICD-10-CM

## 2014-11-06 DIAGNOSIS — M19079 Primary osteoarthritis, unspecified ankle and foot: Secondary | ICD-10-CM

## 2014-11-06 MED ORDER — DICLOFENAC SODIUM 75 MG PO TBEC: 75.0000 mg | DELAYED_RELEASE_TABLET | Freq: Two times a day (BID) | ORAL | Status: DC

## 2014-11-06 NOTE — Progress Notes (Signed)
Patient ID: Megan Maldonado, female   DOB: [birth year], 54 y.o.   MRN: [id number]  Chief Complaint  Patient presents with  . Ankle Pain    ER follow up on right ankle pain, no injury.    HPI Megan Maldonado is a 54 y.o. female.  She presents as a 54 year old female who had spontaneous onset of dorsal foot pain. 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 She denies any recent trauma.  She complains of severe sharp dull stabbing aching pain on the top of her right foot in the midtarsal joints associated with swelling and stiffness but relieved by 75 mg of diclofenac twice a day. Symptoms worse with standing in one place  Review of systems fatigue excessive nighttime urination back pain swollen joints stiff joints joint pain food allergy numbness and tingling    Review of Systems Review of Systems  Past Medical History  Diagnosis Date  . Chronic back pain   . Arthritis     Past Surgical History  Procedure Laterality Date  . Back surgery    . Tonsillectomy    . Carpal tunnel release      No family history on file.  Social History Social History  Substance Use Topics  . Smoking status: Current Every Day Smoker    Types: Cigarettes  . Smokeless tobacco: Not on file  . Alcohol Use: No    Allergies  Allergen Reactions  . Other     BLOOD PRESSURE MEDICATION-Starts with an "H"    Current Outpatient Prescriptions  Medication Sig Dispense Refill  . diazepam (VALIUM) 5 MG tablet Take 5 mg by mouth daily as needed.    . diclofenac (VOLTAREN) 75 MG EC tablet Take 1 tablet (75 mg total) by mouth 2 (two) times daily. Take with food 60 tablet 2  . HYDROcodone-acetaminophen (VICODIN) 5-500 MG per tablet Take 1 tablet by mouth daily as needed.    Marland Kitchen losartan (COZAAR) 25 MG tablet Take 25 mg by mouth daily.    Marland Kitchen acetaminophen (TYLENOL EX ST ARTHRITIS PAIN) 500 MG tablet Take 500-1,000 mg by mouth daily as needed. For pain    . fexofenadine (ALLEGRA) 180 MG tablet Take 1 tablet (180  mg total) by mouth daily. 10 tablet 0  . hydrOXYzine (VISTARIL) 25 MG capsule 1 at hs for itching. (Patient not taking: Reported on 11/06/2014) 10 capsule 0   No current facility-administered medications for this visit.       Physical Exam Physical Exam Blood pressure 131/85, height 5' 2"  (1.575 m), weight 200 lb (90.719 kg). Appearance, there are no abnormalities in terms of appearance the patient was well-developed and well-nourished. The grooming and hygiene were normal.  Mental status orientation, there was normal alertness and orientation Mood pleasant Ambulatory status normal with no assistive devices  Examination of the right foot Inspection tenderness and swelling over the tarsometatarsal joints Range of motion ankle range of motion is normal with mild tenderness over the fibula Tests for stability ankle stability intact Motor strength  no atrophy in the foot Skin warm dry and intact without laceration or ulceration or erythema Neurologic examination normal sensation Vascular examination normal pulses with warm extremity and normal capillary refill    Data Reviewed X-ray shows some dystrophic calcification dorsal to the midtarsal joints  Assessment  Encounter Diagnosis  Name Primary?  Marland Kitchen Arthritis of foot Yes      Plan  Meds ordered this encounter  Medications  .       Marland Kitchen  diclofenac (VOLTAREN) 75 MG EC tablet    Sig: Take 1 tablet (75 mg total) by mouth 2 (two) times daily. Take with food    Dispense:  60 tablet    Refill:  2    Follow-up as needed and ice at the end of the day for 20 minutes

## 2014-11-06 NOTE — Addendum Note (Signed)
Addended by: Baldomero Lamy B on: 11/06/2014 03:06 PM   Modules accepted: Orders, Medications

## 2014-11-06 NOTE — Patient Instructions (Addendum)
Apply ice at end of day for 20 minutes  Take medication

## 2014-11-06 NOTE — Addendum Note (Signed)
Addended by: Carole Civil on: 11/06/2014 02:58 PM   Modules accepted: Orders

## 2014-11-12 ENCOUNTER — Telehealth: Payer: Self-pay | Admitting: Orthopedic Surgery

## 2014-11-12 NOTE — Telephone Encounter (Signed)
Patient aware to check with pharmacy, the amount filled does not match amount prescribed

## 2014-11-12 NOTE — Telephone Encounter (Signed)
Returned call, no answer, left vm 

## 2014-11-12 NOTE — Telephone Encounter (Signed)
Patient is calling regarding her medication   diclofenac (VOLTAREN) 75 MG EC tablet , she is under the impression she was to get a 90 day supply with refills but when she picked up the medication is was only #14 with no refills, please advise?

## 2014-11-20 ENCOUNTER — Telehealth: Payer: Self-pay | Admitting: *Deleted

## 2014-11-20 ENCOUNTER — Other Ambulatory Visit: Payer: Self-pay | Admitting: *Deleted

## 2014-11-20 DIAGNOSIS — M19079 Primary osteoarthritis, unspecified ankle and foot: Secondary | ICD-10-CM

## 2014-11-20 MED ORDER — DICLOFENAC SODIUM 75 MG PO TBEC: 75.0000 mg | DELAYED_RELEASE_TABLET | Freq: Two times a day (BID) | ORAL | Status: DC

## 2014-11-20 NOTE — Telephone Encounter (Signed)
Patient called stating pharmacy does not have prescription for diclofenac 75 mg #60 with 2 refills that is showing in our system. She states they only have prescription for diclofenac 75 mg #14 0 refills. [facility] verified that was the only prescription they had, so I sent in the correct one that showed in our system.

## 2017-05-20 DIAGNOSIS — M2550 Pain in unspecified joint: Secondary | ICD-10-CM | POA: Diagnosis not present

## 2017-05-20 DIAGNOSIS — M1991 Primary osteoarthritis, unspecified site: Secondary | ICD-10-CM | POA: Diagnosis not present

## 2017-05-20 DIAGNOSIS — Z1389 Encounter for screening for other disorder: Secondary | ICD-10-CM | POA: Diagnosis not present

## 2017-05-20 DIAGNOSIS — F419 Anxiety disorder, unspecified: Secondary | ICD-10-CM | POA: Diagnosis not present

## 2017-05-20 DIAGNOSIS — E669 Obesity, unspecified: Secondary | ICD-10-CM | POA: Diagnosis not present

## 2017-05-20 DIAGNOSIS — Z6841 Body Mass Index (BMI) 40.0 and over, adult: Secondary | ICD-10-CM | POA: Diagnosis not present

## 2017-05-20 DIAGNOSIS — G894 Chronic pain syndrome: Secondary | ICD-10-CM | POA: Diagnosis not present

## 2017-05-25 ENCOUNTER — Encounter: Payer: Self-pay | Admitting: Internal Medicine

## 2017-08-19 ENCOUNTER — Encounter

## 2017-08-19 ENCOUNTER — Ambulatory Visit: Payer: BLUE CROSS/BLUE SHIELD | Admitting: Gastroenterology

## 2017-08-19 ENCOUNTER — Encounter: Payer: Self-pay | Admitting: Gastroenterology

## 2017-08-19 ENCOUNTER — Telehealth: Payer: Self-pay

## 2017-08-19 ENCOUNTER — Other Ambulatory Visit: Payer: Self-pay

## 2017-08-19 VITALS — BP 156/76 | HR 54 | Temp 97.3°F | Ht 62.0 in | Wt 232.8 lb

## 2017-08-19 DIAGNOSIS — K9041 Non-celiac gluten sensitivity: Secondary | ICD-10-CM | POA: Insufficient documentation

## 2017-08-19 DIAGNOSIS — Z1211 Encounter for screening for malignant neoplasm of colon: Secondary | ICD-10-CM | POA: Diagnosis not present

## 2017-08-19 DIAGNOSIS — R945 Abnormal results of liver function studies: Secondary | ICD-10-CM | POA: Diagnosis not present

## 2017-08-19 MED ORDER — CLENPIQ 10-3.5-12 MG-GM -GM/160ML PO SOLN: 1.0000 | Freq: Once | ORAL | 0 refills | Status: AC

## 2017-08-19 NOTE — Assessment & Plan Note (Signed)
57 year old female presenting for first ever screening colonoscopy.  Given use of anxiolytics and pain medication, we will plan for deep sedation.  I have discussed the risks, alternatives, benefits with regards to but not limited to the risk of reaction to medication, bleeding, infection, perforation and the patient is agreeable to proceed. Written consent to be obtained.

## 2017-08-19 NOTE — Progress Notes (Signed)
cc'ed to pcp °

## 2017-08-19 NOTE — Patient Instructions (Signed)
1. Colonoscopy as scheduled. See separate instructions.  2. Discuss with Dr. Gerarda Fraction regarding if you have Celiac disease or gluten allergy. I will try to get a copy of your old lab but it may be impossible due to how long ago it was.

## 2017-08-19 NOTE — Progress Notes (Signed)
Primary Care Physician:  Redmond School, MD  Primary Gastroenterologist:  Garfield Cornea, MD   Chief Complaint  Patient presents with  . other    schedule colonoscopy    HPI:  Megan Maldonado is a 57 y.o. female here to schedule first ever colonoscopy at the request of Dr. Gerarda Fraction.  Generally she has been feeling well from a GI standpoint. No problems with bowel movements. No melena, brbpr. Occasional heartburn with certain foods. No dysphagia. No abdominal pain.  No unintentional weight loss.  Family history of colon cancer.  Approximately 8 years ago she states she was having all kinds of problems with vomiting, diarrhea.  Dr. Gerarda Fraction did a bunch of blood work and she issues with gluten.  She was told to go on a gluten-free diet.  She did well after starting gluten-free diet.  It is not clear whether she has celiac disease or gluten allergy/sensitivity.    Current Outpatient Medications  Medication Sig Dispense Refill  . diazepam (VALIUM) 5 MG tablet Take 5 mg by mouth daily as needed.    . diclofenac (VOLTAREN) 75 MG EC tablet Take 1 tablet (75 mg total) by mouth 2 (two) times daily. Take with food 60 tablet 2  . HYDROcodone-acetaminophen (VICODIN) 5-500 MG per tablet Take 1 tablet by mouth daily as needed.    Marland Kitchen losartan (COZAAR) 25 MG tablet Take 25 mg by mouth daily.    Marland Kitchen acetaminophen (TYLENOL EX ST ARTHRITIS PAIN) 500 MG tablet Take 500-1,000 mg by mouth daily as needed. For pain    . hydrOXYzine (VISTARIL) 25 MG capsule 1 at hs for itching. (Patient not taking: Reported on 11/06/2014) 10 capsule 0   No current facility-administered medications for this visit.     Allergies as of 08/19/2017 - Review Complete 08/19/2017  Allergen Reaction Noted  . Other  06/09/2011    Past Medical History:  Diagnosis Date  . Anxiety   . Arthritis   . Chronic back pain   . HTN (hypertension)     Past Surgical History:  Procedure Laterality Date  . BACK SURGERY  2008  . CARPAL TUNNEL  RELEASE Bilateral 2002  . TONSILLECTOMY      Family History  Problem Relation Age of Onset  . Pancreatic cancer Maternal Grandmother   . Colon cancer Neg Hx     Social History   Socioeconomic History  . Marital status: Divorced    Spouse name: Not on file  . Number of children: Not on file  . Years of education: Not on file  . Highest education level: Not on file  Occupational History  . Not on file  Social Needs  . Financial resource strain: Not on file  . Food insecurity:    Worry: Not on file    Inability: Not on file  . Transportation needs:    Medical: Not on file    Non-medical: Not on file  Tobacco Use  . Smoking status: Former Smoker    Types: Cigarettes  . Smokeless tobacco: Never Used  . Tobacco comment: Quit smoking x 1 year  Substance and Sexual Activity  . Alcohol use: No  . Drug use: No  . Sexual activity: Yes    Birth control/protection: None  Lifestyle  . Physical activity:    Days per week: Not on file    Minutes per session: Not on file  . Stress: Not on file  Relationships  . Social connections:    Talks on phone: Not on  file    Gets together: Not on file    Attends religious service: Not on file    Active member of club or organization: Not on file    Attends meetings of clubs or organizations: Not on file    Relationship status: Not on file  . Intimate partner violence:    Fear of current or ex partner: Not on file    Emotionally abused: Not on file    Physically abused: Not on file    Forced sexual activity: Not on file  Other Topics Concern  . Not on file  Social History Narrative  . Not on file      ROS:  General: Negative for anorexia, weight loss, fever, chills, fatigue, weakness. Eyes: Negative for vision changes.  ENT: Negative for hoarseness, difficulty swallowing , nasal congestion. CV: Negative for chest pain, angina, palpitations, dyspnea on exertion, peripheral edema.  Respiratory: Negative for dyspnea at rest,  dyspnea on exertion, cough, sputum, wheezing.  GI: See history of present illness. GU:  Negative for dysuria, hematuria, urinary incontinence, urinary frequency, nocturnal urination.  MS: Positive for joint pain, low back pain.  Derm: Negative for rash or itching.  Neuro: Negative for weakness, abnormal sensation, seizure, frequent headaches, memory loss, confusion.  Psych: Negative for anxiety, depression, suicidal ideation, hallucinations.  Endo: Negative for unusual weight change.  Heme: Negative for bruising or bleeding. Allergy: Negative for rash or hives.    Physical Examination:  BP (!) 156/76   Pulse (!) 54   Temp (!) 97.3 F (36.3 C) (Oral)   Ht 5' 2"  (1.575 m)   Wt 232 lb 12.8 oz (105.6 kg)   BMI 42.58 kg/m    General: Well-nourished, well-developed in no acute distress.  Head: Normocephalic, atraumatic.   Eyes: Conjunctiva pink, no icterus. Mouth: Oropharyngeal mucosa moist and pink , no lesions erythema or exudate. Neck: Supple without thyromegaly, masses, or lymphadenopathy.  Lungs: Clear to auscultation bilaterally.  Heart: Regular rate and rhythm, no murmurs rubs or gallops.  Abdomen: Bowel sounds are normal, nontender, nondistended, no hepatosplenomegaly or masses, no abdominal bruits or    hernia , no rebound or guarding.   Rectal: Deferred Extremities: No lower extremity edema. No clubbing or deformities.  Neuro: Alert and oriented x 4 , grossly normal neurologically.  Skin: Warm and dry, no rash or jaundice.   Psych: Alert and cooperative, normal mood and affect.

## 2017-08-19 NOTE — Assessment & Plan Note (Signed)
Unclear if she has true diagnosis of celiac disease.  She is doing well on a gluten-free diet has been on this diet for close years.  At this time serologies and biopsy would not likely be beneficial.  We will try to request old labs for however I cautioned patient that these may no longer be available difficult to obtain.  Just that she discuss further with PCP with her next visit regarding her diagnosis.  Handout regarding celiac disease provided with the patient to educate her on what type of f/u would be indicated if she does have celiac disease. Patient voiced understanding and will address with PCP as well.

## 2017-08-19 NOTE — Telephone Encounter (Signed)
Called and informed pt of pre-op appt 09/30/17 at 1:45pm. Letter mailed.

## 2017-09-13 ENCOUNTER — Telehealth: Payer: Self-pay | Admitting: Gastroenterology

## 2017-09-13 DIAGNOSIS — R7989 Other specified abnormal findings of blood chemistry: Secondary | ICD-10-CM

## 2017-09-13 DIAGNOSIS — K900 Celiac disease: Secondary | ICD-10-CM

## 2017-09-13 DIAGNOSIS — R945 Abnormal results of liver function studies: Secondary | ICD-10-CM

## 2017-09-13 NOTE — Telephone Encounter (Signed)
OK I have ordered labs. Please release. She will need to go to [place].

## 2017-09-13 NOTE — Telephone Encounter (Signed)
Spoke with pt. Pt was notified about lab work and she would like to have the celiac lab work done. Pt would prefer not to add gluten into her diet. Pt is aware that she will also need additional lab work for abnormal blood work.

## 2017-09-13 NOTE — Addendum Note (Signed)
Addended by: Mahala Menghini on: 09/13/2017 10:28 PM   Modules accepted: Orders

## 2017-09-13 NOTE — Telephone Encounter (Signed)
Please let patient know that we were not successful at obtaining old celiac labs from PCP.  We did send her most recent labs however.  August 20, 2017: White blood cell count 7600, hemoglobin 13.6, platelets 335,000, BUN 12, creatinine 0.69, glucose 113, total bilirubin 0.4, alkaline phosphatase 146 high, AST 27, ALT 35 high, cholesterol 218 high, LDL 148 high, TSH 1.150.  As discussed at time of OV. Since she is on gluten free diet, her serologies may not be helpful UNLESS she is consuming some gluten.  We have three choices: check labs and see what they are OR gluten challenge OR treat as celiac. Either way, she needs follow up on abnormal LFTs which could be due to fatty liver, celiac, or other.   LET ME KNOW WHAT SHE DECIDES.

## 2017-09-14 NOTE — Telephone Encounter (Signed)
Lsl, I see lab orders under labs but under order review, order aren't there to be released.

## 2017-09-14 NOTE — Telephone Encounter (Signed)
Noted. Labs were printed off and mailed to pt. Pt is aware that she doesn't have to have orders to have blood work done at [place], due to them being in the system.

## 2017-09-14 NOTE — Telephone Encounter (Signed)
Orders are entered. Look under lab tab and see the orders I put in last night. Ask Tamela Oddi, I'm not sure if these need to be released or not. Did they print? They may not have printed since I placed order remotely from home.

## 2017-09-23 DIAGNOSIS — K900 Celiac disease: Secondary | ICD-10-CM | POA: Diagnosis not present

## 2017-09-23 DIAGNOSIS — R945 Abnormal results of liver function studies: Secondary | ICD-10-CM | POA: Diagnosis not present

## 2017-09-27 NOTE — Patient Instructions (Signed)
   Your procedure is scheduled on: 10/04/2017  Report to [facility] at    9:00 AM.  Call this number if you have problems the morning of surgery: [phone]   Remember:              Follow Directions on the letter you received from Your Physician's office regarding the Bowel Prep  :  Take these medicines the morning of surgery with A SIP OF WATER: Losartan, and Hydrocodone if needed   Do not wear jewelry, make-up or nail polish.    Do not bring valuables to the hospital.  Contacts, dentures or bridgework may not be worn into surgery.  .   Patients discharged the day of surgery will not be allowed to drive home.     Colonoscopy, Adult, Care After This sheet gives you information about how to care for yourself after your procedure. Your health care provider may also give you more specific instructions. If you have problems or questions, contact your health care provider. What can I expect after the procedure? After the procedure, it is common to have:  A small amount of blood in your stool for 24 hours after the procedure.  Some gas.  Mild abdominal cramping or bloating.  Follow these instructions at home: General instructions   For the first 24 hours after the procedure: ? Do not drive or use machinery. ? Do not sign important documents. ? Do not drink alcohol. ? Do your regular daily activities at a slower pace than normal. ? Eat soft, easy-to-digest foods. ? Rest often.  Take over-the-counter or prescription medicines only as told by your health care provider.  It is up to you to get the results of your procedure. Ask your health care provider, or the department performing the procedure, when your results will be ready. Relieving cramping and bloating  Try walking around when you have cramps or feel bloated.  Apply heat to your abdomen as told by your health care provider. Use a heat source that your health care provider recommends, such as a moist heat pack or a  heating pad. ? Place a towel between your skin and the heat source. ? Leave the heat on for 20-30 minutes. ? Remove the heat if your skin turns bright red. This is especially important if you are unable to feel pain, heat, or cold. You may have a greater risk of getting burned. Eating and drinking  Drink enough fluid to keep your urine clear or pale yellow.  Resume your normal diet as instructed by your health care provider. Avoid heavy or fried foods that are hard to digest.  Avoid drinking alcohol for as long as instructed by your health care provider. Contact a health care provider if:  You have blood in your stool 2-3 days after the procedure. Get help right away if:  You have more than a small spotting of blood in your stool.  You pass large blood clots in your stool.  Your abdomen is swollen.  You have nausea or vomiting.  You have a fever.  You have increasing abdominal pain that is not relieved with medicine. This information is not intended to replace advice given to you by your health care provider. Make sure you discuss any questions you have with your health care provider. Document Released: 08/20/2003 Document Revised: 09/30/2015 Document Reviewed: 03/19/2015 Elsevier Interactive Patient Education  [employer].

## 2017-09-28 ENCOUNTER — Other Ambulatory Visit (HOSPITAL_COMMUNITY): Payer: BLUE CROSS/BLUE SHIELD

## 2017-09-30 ENCOUNTER — Encounter (HOSPITAL_COMMUNITY)
Admission: RE | Admit: 2017-09-30 | Discharge: 2017-09-30 | Disposition: A | Discharge status: Home / Self Care | Payer: BLUE CROSS/BLUE SHIELD | Source: Ambulatory Visit | Attending: Internal Medicine | Admitting: Internal Medicine

## 2017-09-30 ENCOUNTER — Encounter (HOSPITAL_COMMUNITY): Payer: Self-pay

## 2017-09-30 DIAGNOSIS — R000 Tachycardia, unspecified: Secondary | ICD-10-CM | POA: Diagnosis not present

## 2017-09-30 DIAGNOSIS — Z01818 Encounter for other preprocedural examination: Secondary | ICD-10-CM | POA: Diagnosis not present

## 2017-09-30 LAB — CBC WITH DIFFERENTIAL/PLATELET
BASOS ABS: 0 10*3/uL (ref 0.0–0.1)
BASOS PCT: 0 %
EOS ABS: 0 10*3/uL (ref 0.0–0.7)
Eosinophils Relative: 0 %
HCT: 39 % (ref 36.0–46.0)
Hemoglobin: 12.8 g/dL (ref 12.0–15.0)
Lymphocytes Relative: 26 %
Lymphs Abs: 3.1 10*3/uL (ref 0.7–4.0)
MCH: 32.7 pg (ref 26.0–34.0)
MCHC: 32.8 g/dL (ref 30.0–36.0)
MCV: 99.7 fL (ref 78.0–100.0)
MONO ABS: 0.6 10*3/uL (ref 0.1–1.0)
MONOS PCT: 5 %
NEUTROS PCT: 69 %
Neutro Abs: 8.2 10*3/uL — ABNORMAL HIGH (ref 1.7–7.7)
Platelets: 327 10*3/uL (ref 150–400)
RBC: 3.91 MIL/uL (ref 3.87–5.11)
RDW: 13.7 % (ref 11.5–15.5)
WBC: 11.9 10*3/uL — ABNORMAL HIGH (ref 4.0–10.5)

## 2017-09-30 LAB — BASIC METABOLIC PANEL
Anion gap: 9 (ref 5–15)
BUN: 12 mg/dL (ref 6–20)
CHLORIDE: 105 mmol/L (ref 98–111)
CO2: 24 mmol/L (ref 22–32)
CREATININE: 0.71 mg/dL (ref 0.44–1.00)
Calcium: 9.3 mg/dL (ref 8.9–10.3)
GFR calc Af Amer: >60 mL/min (ref >60)
GFR calc non Af Amer: >60 mL/min (ref >60)
Glucose, Bld: 163 mg/dL — ABNORMAL HIGH (ref 70–99)
POTASSIUM: 3.5 mmol/L (ref 3.5–5.1)
Sodium: 138 mmol/L (ref 135–145)

## 2017-10-01 LAB — IRON AND TIBC
Iron Saturation: 33 % (ref 15–55)
Iron: 113 ug/dL (ref 27–159)
Total Iron Binding Capacity: 339 ug/dL (ref 250–450)
UIBC: 226 ug/dL (ref 131–425)

## 2017-10-01 LAB — FERRITIN: FERRITIN: 160 ng/mL — AB (ref 15–150)

## 2017-10-01 LAB — CELIAC DISEASE HLA DQ ASSOC.
DQ2 (DQA1 0501/0505,DQB1 02XX): NEGATIVE
DQ8 (DQA1 03XX, DQB1 0302): POSITIVE

## 2017-10-01 LAB — MITOCHONDRIAL ANTIBODIES: Mitochondrial Ab: <20 Units (ref 0.0–20.0)

## 2017-10-01 LAB — HEPATITIS C ANTIBODY

## 2017-10-01 LAB — TISSUE TRANSGLUTAMINASE, IGA

## 2017-10-01 LAB — HEPATITIS B SURFACE ANTIGEN: HEP B S AG: NEGATIVE

## 2017-10-01 LAB — IGA: IGA/IMMUNOGLOBULIN A, SERUM: 181 mg/dL (ref 87–352)

## 2017-10-04 ENCOUNTER — Ambulatory Visit (HOSPITAL_COMMUNITY): Payer: BLUE CROSS/BLUE SHIELD | Admitting: Anesthesiology

## 2017-10-04 ENCOUNTER — Encounter (HOSPITAL_COMMUNITY): Payer: Self-pay | Admitting: *Deleted

## 2017-10-04 ENCOUNTER — Encounter (HOSPITAL_COMMUNITY)
Admission: RE | Disposition: A | Discharge status: Home / Self Care | Payer: Self-pay | Source: Ambulatory Visit | Attending: Internal Medicine

## 2017-10-04 ENCOUNTER — Ambulatory Visit (HOSPITAL_COMMUNITY)
Admission: RE | Admit: 2017-10-04 | Discharge: 2017-10-04 | Disposition: A | Discharge status: Home / Self Care | Payer: BLUE CROSS/BLUE SHIELD | Source: Ambulatory Visit | Attending: Internal Medicine | Admitting: Internal Medicine

## 2017-10-04 DIAGNOSIS — Z1211 Encounter for screening for malignant neoplasm of colon: Secondary | ICD-10-CM | POA: Diagnosis not present

## 2017-10-04 DIAGNOSIS — Z800 Family history of malignant neoplasm of digestive organs: Secondary | ICD-10-CM | POA: Diagnosis not present

## 2017-10-04 DIAGNOSIS — Z79899 Other long term (current) drug therapy: Secondary | ICD-10-CM | POA: Insufficient documentation

## 2017-10-04 DIAGNOSIS — M549 Dorsalgia, unspecified: Secondary | ICD-10-CM | POA: Diagnosis not present

## 2017-10-04 DIAGNOSIS — Z87891 Personal history of nicotine dependence: Secondary | ICD-10-CM | POA: Diagnosis not present

## 2017-10-04 DIAGNOSIS — K621 Rectal polyp: Secondary | ICD-10-CM | POA: Diagnosis not present

## 2017-10-04 DIAGNOSIS — F419 Anxiety disorder, unspecified: Secondary | ICD-10-CM | POA: Diagnosis not present

## 2017-10-04 DIAGNOSIS — K5730 Diverticulosis of large intestine without perforation or abscess without bleeding: Secondary | ICD-10-CM | POA: Diagnosis not present

## 2017-10-04 DIAGNOSIS — G8929 Other chronic pain: Secondary | ICD-10-CM | POA: Insufficient documentation

## 2017-10-04 DIAGNOSIS — Z7982 Long term (current) use of aspirin: Secondary | ICD-10-CM | POA: Diagnosis not present

## 2017-10-04 DIAGNOSIS — I10 Essential (primary) hypertension: Secondary | ICD-10-CM | POA: Insufficient documentation

## 2017-10-04 DIAGNOSIS — M1990 Unspecified osteoarthritis, unspecified site: Secondary | ICD-10-CM | POA: Insufficient documentation

## 2017-10-04 HISTORY — PX: POLYPECTOMY: SHX5525

## 2017-10-04 HISTORY — PX: COLONOSCOPY WITH PROPOFOL: SHX5780

## 2017-10-04 SURGERY — COLONOSCOPY WITH PROPOFOL
Anesthesia: Monitor Anesthesia Care

## 2017-10-04 MED ORDER — LACTATED RINGERS IV SOLN
INTRAVENOUS | Status: DC | PRN
  Administered 2017-10-04 (×2): via INTRAVENOUS

## 2017-10-04 MED ORDER — CHLORHEXIDINE GLUCONATE CLOTH 2 % EX PADS: 6.0000 | MEDICATED_PAD | Freq: Once | CUTANEOUS | Status: DC

## 2017-10-04 MED ORDER — PROPOFOL 10 MG/ML IV BOLUS
INTRAVENOUS | Status: AC
  Filled 2017-10-04: qty 60

## 2017-10-04 MED ORDER — PROPOFOL 500 MG/50ML IV EMUL
INTRAVENOUS | Status: DC | PRN
  Administered 2017-10-04: 150 ug/kg/min via INTRAVENOUS

## 2017-10-04 MED ORDER — PROPOFOL 10 MG/ML IV BOLUS
INTRAVENOUS | Status: DC | PRN
  Administered 2017-10-04: 40 mg via INTRAVENOUS

## 2017-10-04 MED ORDER — PROPOFOL 10 MG/ML IV BOLUS
INTRAVENOUS | Status: AC
  Filled 2017-10-04: qty 20

## 2017-10-04 MED ORDER — LACTATED RINGERS IV SOLN: INTRAVENOUS | Status: DC | PRN

## 2017-10-04 MED ORDER — LACTATED RINGERS IV SOLN: INTRAVENOUS | Status: DC

## 2017-10-04 NOTE — Anesthesia Preprocedure Evaluation (Signed)
Anesthesia Evaluation  Patient identified by MRN, date of birth, ID band Patient awake    Reviewed: Allergy & Precautions, H&P , NPO status , Patient's Chart, lab work & pertinent test results  Airway Mallampati: I  TM Distance: >3 FB Neck ROM: full    Dental no notable dental hx. (+) Edentulous Upper, Edentulous Lower   Pulmonary neg pulmonary ROS, former smoker,    Pulmonary exam normal breath sounds clear to auscultation       Cardiovascular Exercise Tolerance: Good hypertension, negative cardio ROS   Rhythm:regular Rate:Normal     Neuro/Psych negative neurological ROS  negative psych ROS   GI/Hepatic negative GI ROS, Neg liver ROS,   Endo/Other  negative endocrine ROSMorbid obesity  Renal/GU negative Renal ROS  negative genitourinary   Musculoskeletal   Abdominal   Peds  Hematology negative hematology ROS (+)   Anesthesia Other Findings   Reproductive/Obstetrics negative OB ROS                             Anesthesia Physical Anesthesia Plan  ASA: III  Anesthesia Plan: MAC   Post-op Pain Management:    Induction:   PONV Risk Score and Plan:   Airway Management Planned:   Additional Equipment:   Intra-op Plan:   Post-operative Plan:   Informed Consent: I have reviewed the patients History and Physical, chart, labs and discussed the procedure including the risks, benefits and alternatives for the proposed anesthesia with the patient or authorized representative who has indicated his/her understanding and acceptance.   Dental Advisory Given  Plan Discussed with: CRNA  Anesthesia Plan Comments:         Anesthesia Quick Evaluation

## 2017-10-04 NOTE — Transfer of Care (Signed)
Immediate Anesthesia Transfer of Care Note  Patient: Megan Maldonado  Procedure(s) Performed: COLONOSCOPY WITH PROPOFOL (N/A ) POLYPECTOMY  Patient Location: PACU  Anesthesia Type:MAC  Level of Consciousness: awake, alert , oriented and patient cooperative  Airway & Oxygen Therapy: Patient Spontanous Breathing  Post-op Assessment: Report given to RN and Post -op Vital signs reviewed and stable  Post vital signs: Reviewed and stable  Last Vitals:  Vitals Value Taken Time  BP    Temp    Pulse 91 10/04/2017 10:34 AM  Resp 32 10/04/2017 10:34 AM  SpO2 96 % 10/04/2017 10:34 AM  Vitals shown include unvalidated device data.  Last Pain:  Vitals:   10/04/17 0909  TempSrc: Oral  PainSc: 0-No pain      Patients Stated Pain Goal: 8 (20/94/70 9628)  Complications: No apparent anesthesia complications

## 2017-10-04 NOTE — Anesthesia Procedure Notes (Signed)
Procedure Name: [place] Performed by: Andree Elk Amy A, CRNA Pre-anesthesia Checklist: Patient identified, Emergency Drugs available, Suction available, Patient being monitored and Timeout performed Oxygen Delivery Method: Simple face mask

## 2017-10-04 NOTE — Discharge Instructions (Signed)
Colonoscopy Discharge Instructions  Read the instructions outlined below and refer to this sheet in the next few weeks. These discharge instructions provide you with general information on caring for yourself after you leave the hospital. Your doctor may also give you specific instructions. While your treatment has been planned according to the most current medical practices available, unavoidable complications occasionally occur. If you have any problems or questions after discharge, call Dr. Gala Romney at [phone]. ACTIVITY  You may resume your regular activity, but move at a slower pace for the next 24 hours.   Take frequent rest periods for the next 24 hours.   Walking will help get rid of the air and reduce the bloated feeling in your belly (abdomen).   No driving for 24 hours (because of the medicine (anesthesia) used during the test).    Do not sign any important legal documents or operate any machinery for 24 hours (because of the anesthesia used during the test).  NUTRITION  Drink plenty of fluids.   You may resume your normal diet as instructed by your doctor.   Begin with a light meal and progress to your normal diet. Heavy or fried foods are harder to digest and may make you feel sick to your stomach (nauseated).   Avoid alcoholic beverages for 24 hours or as instructed.  MEDICATIONS  You may resume your normal medications unless your doctor tells you otherwise.  WHAT YOU CAN EXPECT TODAY  Some feelings of bloating in the abdomen.   Passage of more gas than usual.   Spotting of blood in your stool or on the toilet paper.  IF YOU HAD POLYPS REMOVED DURING THE COLONOSCOPY:  No aspirin products for 7 days or as instructed.   No alcohol for 7 days or as instructed.   Eat a soft diet for the next 24 hours.  FINDING OUT THE RESULTS OF YOUR TEST Not all test results are available during your visit. If your test results are not back during the visit, make an appointment  with your caregiver to find out the results. Do not assume everything is normal if you have not heard from your caregiver or the medical facility. It is important for you to follow up on all of your test results.  SEEK IMMEDIATE MEDICAL ATTENTION IF:  You have more than a spotting of blood in your stool.   Your belly is swollen (abdominal distention).   You are nauseated or vomiting.   You have a temperature over 101.   You have abdominal pain or discomfort that is severe or gets worse throughout the day.   Diverticulosis Diverticulosis is a condition that develops when small pouches (diverticula) form in the wall of the large intestine (colon). The colon is where water is absorbed and stool is formed. The pouches form when the inside layer of the colon pushes through weak spots in the outer layers of the colon. You may have a few pouches or many of them. What are the causes? The cause of this condition is not known. What increases the risk? The following factors may make you more likely to develop this condition:  Being older than age 62. Your risk for this condition increases with age. Diverticulosis is rare among people younger than age 48. By age 46, many people have it.  Eating a low-fiber diet.  Having frequent constipation.  Being overweight.  Not getting enough exercise.  Smoking.  Taking over-the-counter pain medicines, like aspirin and ibuprofen.  Having a  family history of diverticulosis.  What are the signs or symptoms? In most people, there are no symptoms of this condition. If you do have symptoms, they may include:  Bloating.  Cramps in the abdomen.  Constipation or diarrhea.  Pain in the lower left side of the abdomen.  How is this diagnosed? This condition is most often diagnosed during an exam for other colon problems. Because diverticulosis usually has no symptoms, it often cannot be diagnosed independently. This condition may be diagnosed  by:  Using a flexible scope to examine the colon (colonoscopy).  Taking an X-ray of the colon after dye has been put into the colon (barium enema).  Doing a CT scan.  How is this treated? You may not need treatment for this condition if you have never developed an infection related to diverticulosis. If you have had an infection before, treatment may include:  Eating a high-fiber diet. This may include eating more fruits, vegetables, and grains.  Taking a fiber supplement.  Taking a live bacteria supplement (probiotic).  Taking medicine to relax your colon.  Taking antibiotic medicines.  Follow these instructions at home:  Drink 6-8 glasses of water or more each day to prevent constipation.  Try not to strain when you have a bowel movement.  If you have had an infection before: ? Eat more fiber as directed by your health care provider or your diet and nutrition specialist (dietitian). ? Take a fiber supplement or probiotic, if your health care provider approves.  Take over-the-counter and prescription medicines only as told by your health care provider.  If you were prescribed an antibiotic, take it as told by your health care provider. Do not stop taking the antibiotic even if you start to feel better.  Keep all follow-up visits as told by your health care provider. This is important. Contact a health care provider if:  You have pain in your abdomen.  You have bloating.  You have cramps.  You have not had a bowel movement in 3 days. Get help right away if:  Your pain gets worse.  Your bloating becomes very bad.  You have a fever or chills, and your symptoms suddenly get worse.  You vomit.  You have bowel movements that are bloody or black.  You have bleeding from your rectum. Summary  Diverticulosis is a condition that develops when small pouches (diverticula) form in the wall of the large intestine (colon).  You may have a few pouches or many of  them.  This condition is most often diagnosed during an exam for other colon problems.  If you have had an infection related to diverticulosis, treatment may include increasing the fiber in your diet, taking supplements, or taking medicines. This information is not intended to replace advice given to you by your health care provider. Make sure you discuss any questions you have with your health care provider. Document Released: 10/03/2003 Document Revised: 11/25/2015 Document Reviewed: 11/25/2015 Elsevier Interactive Patient Education  2017 [employer].  Colon Polyps Polyps are tissue growths inside the body. Polyps can grow in many places, including the large intestine (colon). A polyp may be a round bump or a mushroom-shaped growth. You could have one polyp or several. Most colon polyps are noncancerous (benign). However, some colon polyps can become cancerous over time. What are the causes? The exact cause of colon polyps is not known. What increases the risk? This condition is more likely to develop in people who:  Have a family history  of colon cancer or colon polyps.  Are older than 69 or older than 45 if they are African American.  Have inflammatory bowel disease, such as ulcerative colitis or Crohn disease.  Are overweight.  Smoke cigarettes.  Do not get enough exercise.  Drink too much alcohol.  Eat a diet that is: ? High in fat and red meat. ? Low in fiber.  Had childhood cancer that was treated with abdominal radiation.  What are the signs or symptoms? Most polyps do not cause symptoms. If you have symptoms, they may include:  Blood coming from your rectum when having a bowel movement.  Blood in your stool.The stool may look dark red or black.  A change in bowel habits, such as constipation or diarrhea.  How is this diagnosed? This condition is diagnosed with a colonoscopy. This is a procedure that uses a lighted, flexible scope to look at the inside of  your colon. How is this treated? Treatment for this condition involves removing any polyps that are found. Those polyps will then be tested for cancer. If cancer is found, your health care provider will talk to you about options for colon cancer treatment. Follow these instructions at home: Diet  Eat plenty of fiber, such as fruits, vegetables, and whole grains.  Eat foods that are high in calcium and vitamin D, such as milk, cheese, yogurt, eggs, liver, fish, and broccoli.  Limit foods high in fat, red meats, and processed meats, such as hot dogs, sausage, bacon, and lunch meats.  Maintain a healthy weight, or lose weight if recommended by your health care provider. General instructions  Do not smoke cigarettes.  Do not drink alcohol excessively.  Keep all follow-up visits as told by your health care provider. This is important. This includes keeping regularly scheduled colonoscopies. Talk to your health care provider about when you need a colonoscopy.  Exercise every day or as told by your health care provider. Contact a health care provider if:  You have new or worsening bleeding during a bowel movement.  You have new or increased blood in your stool.  You have a change in bowel habits.  You unexpectedly lose weight. This information is not intended to replace advice given to you by your health care provider. Make sure you discuss any questions you have with your health care provider. Document Released: 10/02/2003 Document Revised: 06/13/2015 Document Reviewed: 11/26/2014 Elsevier Interactive Patient Education  2018 [employer].     Diverticulosis and polyp information provided  Further recommendations to follow pending pathology report

## 2017-10-04 NOTE — H&P (Signed)
@LOGO @   Primary Care Physician:  Redmond School, MD Primary Gastroenterologist:  Dr. Gala Romney  Pre-Procedure History & Physical: HPI:  Megan Maldonado is a 57 y.o. female is here for a screening colonoscopy.   First ever average risk screening examination.No lower GI tract symptoms.  Past Medical History:  Diagnosis Date  . Anxiety   . Arthritis   . Chronic back pain   . HTN (hypertension)     Past Surgical History:  Procedure Laterality Date  . BACK SURGERY  2008  . CARPAL TUNNEL RELEASE Bilateral 2002  . TONSILLECTOMY      Prior to Admission medications   Medication Sig Start Date End Date Taking? Authorizing Provider  acetaminophen (TYLENOL EX ST ARTHRITIS PAIN) 500 MG tablet Take 500-1,000 mg by mouth daily as needed for moderate pain.    Yes [provider]  aspirin EC 81 MG tablet Take 81 mg by mouth daily.   Yes [provider]  diclofenac (VOLTAREN) 75 MG EC tablet Take 1 tablet (75 mg total) by mouth 2 (two) times daily. Take with food 11/20/14  Yes Carole Civil, MD  ibuprofen (ADVIL,MOTRIN) 200 MG tablet Take 200 mg by mouth every 6 (six) hours as needed for moderate pain.   Yes [provider]  losartan (COZAAR) 25 MG tablet Take 25 mg by mouth daily.   Yes [provider]  diazepam (VALIUM) 5 MG tablet Take 5 mg by mouth daily as needed for muscle spasms.     [provider]  HYDROcodone-acetaminophen (NORCO) 10-325 MG tablet Take 1 tablet by mouth every 6 (six) hours as needed for pain.     [provider]  hydrOXYzine (VISTARIL) 25 MG capsule 1 at hs for itching. Patient not taking: Reported on 11/06/2014 06/09/11   Lily Kocher, PA-C    Allergies as of 08/19/2017 - Review Complete 08/19/2017  Allergen Reaction Noted  . Other  06/09/2011    Family History  Problem Relation Age of Onset  . Pancreatic cancer Maternal Grandmother   . Colon cancer Neg Hx     Social History   Socioeconomic History  .  Marital status: Divorced    Spouse name: Not on file  . Number of children: Not on file  . Years of education: Not on file  . Highest education level: Not on file  Occupational History  . Not on file  Social Needs  . Financial resource strain: Not on file  . Food insecurity:    Worry: Not on file    Inability: Not on file  . Transportation needs:    Medical: Not on file    Non-medical: Not on file  Tobacco Use  . Smoking status: Former Smoker    Packs/day: 1.00    Years: 25.00    Pack years: 25.00    Types: Cigarettes    Last attempt to quit: 09/30/2016    Years since quitting: 1.0  . Smokeless tobacco: Never Used  . Tobacco comment: Quit smoking x 1 year  Substance and Sexual Activity  . Alcohol use: No  . Drug use: No  . Sexual activity: Yes    Birth control/protection: None  Lifestyle  . Physical activity:    Days per week: Not on file    Minutes per session: Not on file  . Stress: Not on file  Relationships  . Social connections:    Talks on phone: Not on file    Gets together: Not on file  Attends religious service: Not on file    Active member of club or organization: Not on file    Attends meetings of clubs or organizations: Not on file    Relationship status: Not on file  . Intimate partner violence:    Fear of current or ex partner: Not on file    Emotionally abused: Not on file    Physically abused: Not on file    Forced sexual activity: Not on file  Other Topics Concern  . Not on file  Social History Narrative  . Not on file    Review of Systems: See HPI, otherwise negative ROS  Physical Exam: BP (!) 163/80   Pulse 85   Temp 98.3 F (36.8 C) (Oral)   SpO2 95%  General:   Alert,  Well-developed, well-nourished, pleasant and cooperative in NAD Lungs:  Clear throughout to auscultation.   No wheezes, crackles, or rhonchi. No acute distress. Heart:  Regular rate and rhythm; no murmurs, clicks, rubs,  or gallops. Abdomen:  Soft, nontender and  nondistended. No masses, hepatosplenomegaly or hernias noted. Normal bowel sounds, without guarding, and without rebound.    Impression/Plan: Megan Maldonado is now here to undergo a screening colonoscopy.  First ever average risk screening examination.  Risks, benefits, limitations, imponderables and alternatives regarding colonoscopy have been reviewed with the patient. Questions have been answered. All parties agreeable.     Notice:  This dictation was prepared with Dragon dictation along with smaller phrase technology. Any transcriptional errors that result from this process are unintentional and may not be corrected upon review.

## 2017-10-04 NOTE — Anesthesia Postprocedure Evaluation (Signed)
Anesthesia Post Note  Patient: Megan Maldonado  Procedure(s) Performed: COLONOSCOPY WITH PROPOFOL (N/A ) POLYPECTOMY  Patient location during evaluation: PACU Anesthesia Type: MAC Level of consciousness: awake and alert and oriented Pain management: pain level controlled Vital Signs Assessment: post-procedure vital signs reviewed and stable Respiratory status: spontaneous breathing Cardiovascular status: stable Postop Assessment: no apparent nausea or vomiting Anesthetic complications: no     Last Vitals:  Vitals:   10/04/17 0909  BP: (!) 163/80  Pulse: 85  Temp: 36.8 C  SpO2: 95%    Last Pain:  Vitals:   10/04/17 0909  TempSrc: Oral  PainSc: 0-No pain                 ADAMS, AMY A

## 2017-10-04 NOTE — Op Note (Signed)
[facility] Patient Name: Megan Maldonado Procedure Date: 10/04/2017 9:57 AM MRN: [id number] Date of Birth: [birth year] Attending MD: Norvel Richards , MD CSN: [id number] Age: 57 Admit Type: Outpatient Procedure:                Colonoscopy Indications:              Screening for colorectal malignant neoplasm Providers:                Norvel Richards, MD, Lurline Del, RN, Nelma Rothman, Technician Referring MD:              Medicines:                Propofol per Anesthesia Complications:            No immediate complications. Estimated Blood Loss:     Estimated blood loss was minimal. Procedure:                Pre-Anesthesia Assessment:                           - Prior to the procedure, a History and Physical                            was performed, and patient medications and                            allergies were reviewed. The patient's tolerance of                            previous anesthesia was also reviewed. The risks                            and benefits of the procedure and the sedation                            options and risks were discussed with the patient.                            All questions were answered, and informed consent                            was obtained. Prior Anticoagulants: The patient has                            taken no previous anticoagulant or antiplatelet                            agents. ASA Grade Assessment: II - A patient with                            mild systemic disease. After reviewing the risks  and benefits, the patient was deemed in                            satisfactory condition to undergo the procedure.                           After obtaining informed consent, the colonoscope                            was passed under direct vision. Throughout the                            procedure, the patient's blood pressure, pulse, and                            oxygen  saturations were monitored continuously. The                            CF-[id number] ([id number]) scope was introduced through                            the anus and advanced to the the cecum, identified                            by appendiceal orifice and ileocecal valve. The                            colonoscopy was performed without difficulty. The                            patient tolerated the procedure well. The quality                            of the bowel preparation was adequate. The                            ileocecal valve, appendiceal orifice, and rectum                            were photographed. The entire colon was well                            visualized. Scope In: 10:09:32 AM Scope Out: 10:26:30 AM Scope Withdrawal Time: 0 hours 9 minutes 43 seconds  Total Procedure Duration: 0 hours 16 minutes 58 seconds  Findings:      The perianal and digital rectal examinations were normal.      Two sessile polyps were found in the rectum. The polyps were 4 to 5 mm       in size. These polyps were removed with a cold snare. Resection and       retrieval were complete. Estimated blood loss was minimal.      Scattered medium-mouthed diverticula were found in the sigmoid colon and       descending colon. Estimated blood loss was minimal.  The exam was otherwise without abnormality on direct and retroflexion       views. Impression:               - Two 4 to 5 mm polyps in the rectum, removed with                            a cold snare. Resected and retrieved.                           - Diverticulosis in the sigmoid colon and in the                            descending colon.                           - The examination was otherwise normal on direct                            and retroflexion views. Moderate Sedation:      Moderate (conscious) sedation was personally administered by an       anesthesia professional. The following parameters were monitored: oxygen        saturation, heart rate, blood pressure, respiratory rate, EKG, adequacy       of pulmonary ventilation, and response to care. Total physician       intraservice time was 22 minutes. Recommendation:           - Patient has a contact number available for                            emergencies. The signs and symptoms of potential                            delayed complications were discussed with the                            patient. Return to normal activities tomorrow.                            Written discharge instructions were provided to the                            patient.                           - Resume previous diet.                           - Continue present medications.                           - Return to GI office (date not yet determined).                           - Repeat colonoscopy date to be determined after  pending pathology results are reviewed for                            surveillance based on pathology results. Procedure Code(s):        --- Professional ---                           [phone], Colonoscopy, flexible; with removal of                            tumor(s), polyp(s), or other lesion(s) by snare                            technique Diagnosis Code(s):        --- Professional ---                           Z12.11, Encounter for screening for malignant                            neoplasm of colon                           K62.1, Rectal polyp                           K57.30, Diverticulosis of large intestine without                            perforation or abscess without bleeding CPT copyright 2017 American [employer]. All rights reserved. The codes documented in this report are preliminary and upon coder review may  be revised to meet current compliance requirements. Megan Maldonado. Megan Melendrez, MD Norvel Richards, MD 10/04/2017 10:35:42 AM This report has been signed electronically. Number of Addenda: 0

## 2017-10-05 ENCOUNTER — Other Ambulatory Visit: Payer: Self-pay

## 2017-10-05 DIAGNOSIS — R7989 Other specified abnormal findings of blood chemistry: Secondary | ICD-10-CM

## 2017-10-06 ENCOUNTER — Other Ambulatory Visit (HOSPITAL_COMMUNITY): Payer: Self-pay | Admitting: Internal Medicine

## 2017-10-06 DIAGNOSIS — Z1231 Encounter for screening mammogram for malignant neoplasm of breast: Secondary | ICD-10-CM

## 2017-10-07 ENCOUNTER — Encounter (HOSPITAL_COMMUNITY): Payer: Self-pay | Admitting: Internal Medicine

## 2017-10-08 ENCOUNTER — Encounter: Payer: Self-pay | Admitting: Internal Medicine

## 2017-11-04 ENCOUNTER — Encounter (HOSPITAL_COMMUNITY): Payer: Self-pay

## 2017-11-04 ENCOUNTER — Ambulatory Visit (HOSPITAL_COMMUNITY)
Admission: RE | Admit: 2017-11-04 | Discharge: 2017-11-04 | Disposition: A | Discharge status: Home / Self Care | Payer: BLUE CROSS/BLUE SHIELD | Source: Ambulatory Visit | Attending: Internal Medicine | Admitting: Internal Medicine

## 2017-11-04 DIAGNOSIS — Z1231 Encounter for screening mammogram for malignant neoplasm of breast: Secondary | ICD-10-CM

## 2017-11-11 DIAGNOSIS — Z6841 Body Mass Index (BMI) 40.0 and over, adult: Secondary | ICD-10-CM | POA: Diagnosis not present

## 2017-11-11 DIAGNOSIS — M1991 Primary osteoarthritis, unspecified site: Secondary | ICD-10-CM | POA: Diagnosis not present

## 2017-11-11 DIAGNOSIS — Z0000 Encounter for general adult medical examination without abnormal findings: Secondary | ICD-10-CM | POA: Diagnosis not present

## 2017-11-11 DIAGNOSIS — Z1389 Encounter for screening for other disorder: Secondary | ICD-10-CM | POA: Diagnosis not present

## 2017-11-11 DIAGNOSIS — Z23 Encounter for immunization: Secondary | ICD-10-CM | POA: Diagnosis not present

## 2017-11-11 DIAGNOSIS — I10 Essential (primary) hypertension: Secondary | ICD-10-CM | POA: Diagnosis not present

## 2017-11-11 DIAGNOSIS — G894 Chronic pain syndrome: Secondary | ICD-10-CM | POA: Diagnosis not present

## 2017-11-18 DIAGNOSIS — Z6841 Body Mass Index (BMI) 40.0 and over, adult: Secondary | ICD-10-CM | POA: Diagnosis not present

## 2017-11-18 DIAGNOSIS — Z23 Encounter for immunization: Secondary | ICD-10-CM | POA: Diagnosis not present

## 2017-11-18 DIAGNOSIS — Z1389 Encounter for screening for other disorder: Secondary | ICD-10-CM | POA: Diagnosis not present

## 2017-12-20 ENCOUNTER — Other Ambulatory Visit: Payer: Self-pay

## 2017-12-20 DIAGNOSIS — R7989 Other specified abnormal findings of blood chemistry: Secondary | ICD-10-CM

## 2018-04-28 DIAGNOSIS — E7849 Other hyperlipidemia: Secondary | ICD-10-CM | POA: Diagnosis not present

## 2018-04-28 DIAGNOSIS — I10 Essential (primary) hypertension: Secondary | ICD-10-CM | POA: Diagnosis not present

## 2018-04-28 DIAGNOSIS — Z1389 Encounter for screening for other disorder: Secondary | ICD-10-CM | POA: Diagnosis not present

## 2018-04-28 DIAGNOSIS — M1991 Primary osteoarthritis, unspecified site: Secondary | ICD-10-CM | POA: Diagnosis not present

## 2018-04-28 DIAGNOSIS — Z6841 Body Mass Index (BMI) 40.0 and over, adult: Secondary | ICD-10-CM | POA: Diagnosis not present

## 2018-07-06 ENCOUNTER — Other Ambulatory Visit: Payer: Self-pay

## 2018-07-06 ENCOUNTER — Ambulatory Visit (INDEPENDENT_AMBULATORY_CARE_PROVIDER_SITE_OTHER): Payer: BC Managed Care – PPO

## 2018-07-06 ENCOUNTER — Encounter: Payer: Self-pay | Admitting: Orthopedic Surgery

## 2018-07-06 ENCOUNTER — Ambulatory Visit: Payer: BC Managed Care – PPO | Admitting: Orthopedic Surgery

## 2018-07-06 VITALS — BP 166/88 | HR 102 | Temp 96.6°F | Ht 62.0 in | Wt 240.0 lb

## 2018-07-06 DIAGNOSIS — M25562 Pain in left knee: Secondary | ICD-10-CM

## 2018-07-06 DIAGNOSIS — M25561 Pain in right knee: Secondary | ICD-10-CM

## 2018-07-06 DIAGNOSIS — M170 Bilateral primary osteoarthritis of knee: Secondary | ICD-10-CM | POA: Diagnosis not present

## 2018-07-06 DIAGNOSIS — G8929 Other chronic pain: Secondary | ICD-10-CM

## 2018-07-06 NOTE — Patient Instructions (Addendum)
Continue light exercise if possible Try to lose some weight Continue with meloxicam   Calorie Counting for Weight Loss Calories are units of energy. Your body needs a certain amount of calories from food to keep you going throughout the day. When you eat more calories than your body needs, your body stores the extra calories as fat. When you eat fewer calories than your body needs, your body burns fat to get the energy it needs. Calorie counting means keeping track of how many calories you eat and drink each day. Calorie counting can be helpful if you need to lose weight. If you make sure to eat fewer calories than your body needs, you should lose weight. Ask your health care provider what a healthy weight is for you. For calorie counting to work, you will need to eat the right number of calories in a day in order to lose a healthy amount of weight per week. A dietitian can help you determine how many calories you need in a day and will give you suggestions on how to reach your calorie goal.  A healthy amount of weight to lose per week is usually 1-2 lb (0.5-0.9 kg). This usually means that your daily calorie intake should be reduced by 500-750 calories.  Eating 1,200 - 1,500 calories per day can help most women lose weight.  Eating 1,500 - 1,800 calories per day can help most men lose weight. What is my plan? My goal is to have __________ calories per day. If I have this many calories per day, I should lose around __________ pounds per week. What do I need to know about calorie counting? In order to meet your daily calorie goal, you will need to:  Find out how many calories are in each food you would like to eat. Try to do this before you eat.  Decide how much of the food you plan to eat.  Write down what you ate and how many calories it had. Doing this is called keeping a food log. To successfully lose weight, it is important to balance calorie counting with a healthy lifestyle that  includes regular activity. Aim for 150 minutes of moderate exercise (such as walking) or 75 minutes of vigorous exercise (such as running) each week. Where do I find calorie information?  The number of calories in a food can be found on a Nutrition Facts label. If a food does not have a Nutrition Facts label, try to look up the calories online or ask your dietitian for help. Remember that calories are listed per serving. If you choose to have more than one serving of a food, you will have to multiply the calories per serving by the amount of servings you plan to eat. For example, the label on a package of bread might say that a serving size is 1 slice and that there are 90 calories in a serving. If you eat 1 slice, you will have eaten 90 calories. If you eat 2 slices, you will have eaten 180 calories. How do I keep a food log? Immediately after each meal, record the following information in your food log:  What you ate. Don't forget to include toppings, sauces, and other extras on the food.  How much you ate. This can be measured in cups, ounces, or number of items.  How many calories each food and drink had.  The total number of calories in the meal. Keep your food log near you, such as in a small  notebook in your pocket, or use a mobile app or website. Some programs will calculate calories for you and show you how many calories you have left for the day to meet your goal. What are some calorie counting tips?   Use your calories on foods and drinks that will fill you up and not leave you hungry: ? Some examples of foods that fill you up are nuts and nut butters, vegetables, lean proteins, and high-fiber foods like whole grains. High-fiber foods are foods with more than 5 g fiber per serving. ? Drinks such as sodas, specialty coffee drinks, alcohol, and juices have a lot of calories, yet do not fill you up.  Eat nutritious foods and avoid empty calories. Empty calories are calories you get  from foods or beverages that do not have many vitamins or protein, such as candy, sweets, and soda. It is better to have a nutritious high-calorie food (such as an avocado) than a food with few nutrients (such as a bag of chips).  Know how many calories are in the foods you eat most often. This will help you calculate calorie counts faster.  Pay attention to calories in drinks. Low-calorie drinks include water and unsweetened drinks.  Pay attention to nutrition labels for "low fat" or "fat free" foods. These foods sometimes have the same amount of calories or more calories than the full fat versions. They also often have added sugar, starch, or salt, to make up for flavor that was removed with the fat.  Find a way of tracking calories that works for you. Get creative. Try different apps or programs if writing down calories does not work for you. What are some portion control tips?  Know how many calories are in a serving. This will help you know how many servings of a certain food you can have.  Use a measuring cup to measure serving sizes. You could also try weighing out portions on a kitchen scale. With time, you will be able to estimate serving sizes for some foods.  Take some time to put servings of different foods on your favorite plates, bowls, and cups so you know what a serving looks like.  Try not to eat straight from a bag or box. Doing this can lead to overeating. Put the amount you would like to eat in a cup or on a plate to make sure you are eating the right portion.  Use smaller plates, glasses, and bowls to prevent overeating.  Try not to multitask (for example, watch TV or use your computer) while eating. If it is time to eat, sit down at a table and enjoy your food. This will help you to know when you are full. It will also help you to be aware of what you are eating and how much you are eating. What are tips for following this plan? Reading food labels  Check the calorie  count compared to the serving size. The serving size may be smaller than what you are used to eating.  Check the source of the calories. Make sure the food you are eating is high in vitamins and protein and low in saturated and trans fats. Shopping  Read nutrition labels while you shop. This will help you make healthy decisions before you decide to purchase your food.  Make a grocery list and stick to it. Cooking  Try to cook your favorite foods in a healthier way. For example, try baking instead of frying.  Use low-fat dairy products.  Meal planning  Use more fruits and vegetables. Half of your plate should be fruits and vegetables.  Include lean proteins like poultry and fish. How do I count calories when eating out?  Ask for smaller portion sizes.  Consider sharing an entree and sides instead of getting your own entree.  If you get your own entree, eat only half. Ask for a box at the beginning of your meal and put the rest of your entree in it so you are not tempted to eat it.  If calories are listed on the menu, choose the lower calorie options.  Choose dishes that include vegetables, fruits, whole grains, low-fat dairy products, and lean protein.  Choose items that are boiled, broiled, grilled, or steamed. Stay away from items that are buttered, battered, fried, or served with cream sauce. Items labeled "crispy" are usually fried, unless stated otherwise.  Choose water, low-fat milk, unsweetened iced tea, or other drinks without added sugar. If you want an alcoholic beverage, choose a lower calorie option such as a glass of wine or light beer.  Ask for dressings, sauces, and syrups on the side. These are usually high in calories, so you should limit the amount you eat.  If you want a salad, choose a garden salad and ask for grilled meats. Avoid extra toppings like bacon, cheese, or fried items. Ask for the dressing on the side, or ask for olive oil and vinegar or lemon to use  as dressing.  Estimate how many servings of a food you are given. For example, a serving of cooked rice is  cup or about the size of half a baseball. Knowing serving sizes will help you be aware of how much food you are eating at restaurants. The list below tells you how big or small some common portion sizes are based on everyday objects: ? 1 oz-4 stacked dice. ? 3 oz-1 deck of cards. ? 1 tsp-1 die. ? 1 Tbsp- a ping-pong ball. ? 2 Tbsp-1 ping-pong ball. ?  cup- baseball. ? 1 cup-1 baseball. Summary  Calorie counting means keeping track of how many calories you eat and drink each day. If you eat fewer calories than your body needs, you should lose weight.  A healthy amount of weight to lose per week is usually 1-2 lb (0.5-0.9 kg). This usually means reducing your daily calorie intake by 500-750 calories.  The number of calories in a food can be found on a Nutrition Facts label. If a food does not have a Nutrition Facts label, try to look up the calories online or ask your dietitian for help.  Use your calories on foods and drinks that will fill you up, and not on foods and drinks that will leave you hungry.  Use smaller plates, glasses, and bowls to prevent overeating. This information is not intended to replace advice given to you by your health care provider. Make sure you discuss any questions you have with your health care provider. Document Released: 01/05/2005 Document Revised: 09/24/2017 Document Reviewed: 12/06/2015 Elsevier Interactive Patient Education  2019 [employer] Inc[employer]  BMI for Adults  Body mass index (BMI) is a number that is calculated from a person's weight and height. BMI may help to estimate how much of a person's weight is composed of fat. BMI can help identify those who may be at higher risk for certain medical problems. How is BMI used with adults? BMI is used as a screening tool to identify possible weight problems. It is used to  check whether a person is  obese, overweight, healthy weight, or underweight. How is BMI calculated? BMI measures your weight and compares it to your height. This can be done either in [place] ([place] or metric measurements. Note that charts are available to help you find your BMI quickly and easily without having to do these calculations yourself. To calculate your BMI in [place] ([place] measurements, your health care provider will: 1. Measure your weight in pounds (lb). 2. Multiply the number of pounds by 703. ? For example, for a person who weighs 180 lb, multiply that number by 703, which equals 126,540. 3. Measure your height in inches (in). Then multiply that number by itself to get a measurement called "inches squared." ? For example, for a person who is 70 in tall, the "inches squared" measurement is 70 in x 70 in, which equals 4900 inches squared. 4. Divide the total from Step 2 (number of lb x 703) by the total from Step 3 (inches squared): 126,540  4900 = 25.8. This is your BMI. To calculate your BMI in metric measurements, your health care provider will: 1. Measure your weight in kilograms (kg). 2. Measure your height in meters (m). Then multiply that number by itself to get a measurement called "meters squared." ? For example, for a person who is 1.75 m tall, the "meters squared" measurement is 1.75 m x 1.75 m, which is equal to 3.1 meters squared. 3. Divide the number of kilograms (your weight) by the meters squared number. In this example: 70  3.1 = 22.6. This is your BMI. How is BMI interpreted? To interpret your results, your health care provider will use BMI charts to identify whether you are underweight, normal weight, overweight, or obese. The following guidelines will be used:  Underweight: BMI less than 18.5.  Normal weight: BMI between 18.5 and 24.9.  Overweight: BMI between 25 and 29.9.  Obese: BMI of 30 and above. Please note:  Weight includes both fat and muscle, so someone with a muscular  build, such as an athlete, may have a BMI that is higher than 24.9. In cases like these, BMI is not an accurate measure of body fat.  To determine if excess body fat is the cause of a BMI of 25 or higher, further assessments may need to be done by a health care provider.  BMI is usually interpreted in the same way for men and women. Why is BMI a useful tool? BMI is useful in two ways:  Identifying a weight problem that may be related to a medical condition, or that may increase the risk for medical problems.  Promoting lifestyle and diet changes in order to reach a healthy weight. Summary  Body mass index (BMI) is a number that is calculated from a person's weight and height.  BMI may help to estimate how much of a person's weight is composed of fat. BMI can help identify those who may be at higher risk for certain medical problems.  BMI can be measured using English measurements or metric measurements.  To interpret your results, your health care provider will use BMI charts to identify whether you are underweight, normal weight, overweight, or obese. This information is not intended to replace advice given to you by your health care provider. Make sure you discuss any questions you have with your health care provider. Document Released: 09/17/2003 Document Revised: 11/18/2016 Document Reviewed: 11/18/2016 Elsevier Interactive Patient Education  2019 [employer] Inc[employer]  Osteoarthritis  Osteoarthritis is a type  of arthritis that affects tissue that covers the ends of bones in joints (cartilage). Cartilage acts as a cushion between the bones and helps them move smoothly. Osteoarthritis results when cartilage in the joints gets worn down. Osteoarthritis is sometimes called "wear and tear" arthritis. Osteoarthritis is the most common form of arthritis. It often occurs in older people. It is a condition that gets worse over time (a progressive condition). Joints that are most often affected by  this condition are in:  Fingers.  Toes.  Hips.  Knees.  Spine, including neck and lower back. What are the causes? This condition is caused by age-related wearing down of cartilage that covers the ends of bones. What increases the risk? The following factors may make you more likely to develop this condition:  Older age.  Being overweight or obese.  Overuse of joints, such as in athletes.  Past injury of a joint.  Past surgery on a joint.  Family history of osteoarthritis. What are the signs or symptoms? The main symptoms of this condition are pain, swelling, and stiffness in the joint. The joint may lose its shape over time. Small pieces of bone or cartilage may break off and float inside of the joint, which may cause more pain and damage to the joint. Small deposits of bone (osteophytes) may grow on the edges of the joint. Other symptoms may include:  A grating or scraping feeling inside the joint when you move it.  Popping or creaking sounds when you move. Symptoms may affect one or more joints. Osteoarthritis in a major joint, such as your knee or hip, can make it painful to walk or exercise. If you have osteoarthritis in your hands, you might not be able to grip items, twist your hand, or control small movements of your hands and fingers (fine motor skills). How is this diagnosed? This condition may be diagnosed based on:  Your medical history.  A physical exam.  Your symptoms.  X-rays of the affected joint(s).  Blood tests to rule out other types of arthritis. How is this treated? There is no cure for this condition, but treatment can help to control pain and improve joint function. Treatment plans may include:  A prescribed exercise program that allows for rest and joint relief. You may work with a physical therapist.  A weight control plan.  Pain relief techniques, such as: ? Applying heat and cold to the joint. ? Electric pulses delivered to nerve  endings under the skin (transcutaneous electrical nerve stimulation, or TENS). ? Massage. ? Certain nutritional supplements.  NSAIDs or prescription medicines to help relieve pain.  Medicine to help relieve pain and inflammation (corticosteroids). This can be given by mouth (orally) or as an injection.  Assistive devices, such as a brace, wrap, splint, specialized glove, or cane.  Surgery, such as: ? An osteotomy. This is done to reposition the bones and relieve pain or to remove loose pieces of bone and cartilage. ? Joint replacement surgery. You may need this surgery if you have very bad (advanced) osteoarthritis. Follow these instructions at home: Activity  Rest your affected joints as directed by your health care provider.  Do not drive or use heavy machinery while taking prescription pain medicine.  Exercise as directed. Your health care provider or physical therapist may recommend specific types of exercise, such as: ? Strengthening exercises. These are done to strengthen the muscles that support joints that are affected by arthritis. They can be performed with weights or with exercise  bands to add resistance. ? Aerobic activities. These are exercises, such as brisk walking or water aerobics, that get your heart pumping. ? Range-of-motion activities. These keep your joints easy to move. ? Balance and agility exercises. Managing pain, stiffness, and swelling      If directed, apply heat to the affected area as often as told by your health care provider. Use the heat source that your health care provider recommends, such as a moist heat pack or a heating pad. ? If you have a removable assistive device, remove it as told by your health care provider. ? Place a towel between your skin and the heat source. If your health care provider tells you to keep the assistive device on while you apply heat, place a towel between the assistive device and the heat source. ? Leave the heat on  for 20-30 minutes. ? Remove the heat if your skin turns bright red. This is especially important if you are unable to feel pain, heat, or cold. You may have a greater risk of getting burned.  If directed, put ice on the affected joint: ? If you have a removable assistive device, remove it as told by your health care provider. ? Put ice in a plastic bag. ? Place a towel between your skin and the bag. If your health care provider tells you to keep the assistive device on during icing, place a towel between the assistive device and the bag. ? Leave the ice on for 20 minutes, 2-3 times a day. General instructions  Take over-the-counter and prescription medicines only as told by your health care provider.  Maintain a healthy weight. Follow instructions from your health care provider for weight control. These may include dietary restrictions.  Do not use any products that contain nicotine or tobacco, such as cigarettes and e-cigarettes. These can delay bone healing. If you need help quitting, ask your health care provider.  Use assistive devices as directed by your health care provider.  Keep all follow-up visits as told by your health care provider. This is important. Where to find more information  [employer] of Arthritis and Musculoskeletal and Skin Diseases: www.niams.[employer].es  [employer] on Aging: http://kim-miller.com/  American College of Rheumatology: www.rheumatology.org Contact a health care provider if:  Your skin turns red.  You develop a rash.  You have pain that gets worse.  You have a fever along with joint or muscle aches. Get help right away if:  You lose a lot of weight.  You suddenly lose your appetite.  You have night sweats. Summary  Osteoarthritis is a type of arthritis that affects tissue covering the ends of bones in joints (cartilage).  This condition is caused by age-related wearing down of cartilage that covers the ends of bones.  The main  symptom of this condition is pain, swelling, and stiffness in the joint.  There is no cure for this condition, but treatment can help to control pain and improve joint function. This information is not intended to replace advice given to you by your health care provider. Make sure you discuss any questions you have with your health care provider. Document Released: 01/05/2005 Document Revised: 10/12/2016 Document Reviewed: 09/09/2015 Elsevier Interactive Patient Education  2019 [employer].

## 2018-07-06 NOTE — Progress Notes (Signed)
NEW PROBLEM OFFICE VISIT  Chief Complaint  Patient presents with  . Knee Pain    Bilateral knee pain for 6 months NKI    58 year old female with bilateral knee pain recently switched from Voltaren to meloxicam due to increasing arthritic symptoms which included bilateral knee pain left thumb pain back pain and right hip pain  She is status post lumbar pedicle fusion  She has had increasing dull aching pain on the medial and lateral sides of both knees over the last month or 2 which is interfered with her walking decreased her ability to exercise with pain when sleeping and getting up and standing which will often cause her to hesitate to start walking.  Her medications include meloxicam recently started aspirin   Review of Systems  Musculoskeletal: Positive for back pain and joint pain.  All other systems reviewed and are negative.    Past Medical History:  Diagnosis Date  . Anxiety   . Arthritis   . Chronic back pain   . HTN (hypertension)     Past Surgical History:  Procedure Laterality Date  . BACK SURGERY  2008  . CARPAL TUNNEL RELEASE Bilateral 2002  . COLONOSCOPY WITH PROPOFOL N/A 10/04/2017   Procedure: COLONOSCOPY WITH PROPOFOL;  Surgeon: Daneil Dolin, MD;  [facility];  Service: Endoscopy;  Laterality: N/A;  10:30am  . POLYPECTOMY  10/04/2017   Procedure: POLYPECTOMY;  Surgeon: Daneil Dolin, MD;  Location: [facility];  Service: Endoscopy;;  rectal  . TONSILLECTOMY      Family History  Problem Relation Age of Onset  . Pancreatic cancer Maternal Grandmother   . Colon cancer Neg Hx    Social History   Tobacco Use  . Smoking status: Former Smoker    Packs/day: 1.00    Years: 25.00    Pack years: 25.00    Types: Cigarettes    Quit date: 09/30/2016    Years since quitting: 1.7  . Smokeless tobacco: Never Used  . Tobacco comment: Quit smoking x 1 year  Substance Use Topics  . Alcohol use: No  . Drug use: No    Allergies  Allergen  Reactions  . Ace Inhibitors     Unknown, family history of swelling and being on ventilator after taking this medication    Current Meds  Medication Sig  . aspirin EC 81 MG tablet Take 81 mg by mouth daily.  . diazepam (VALIUM) 5 MG tablet Take 5 mg by mouth daily as needed for muscle spasms.   Marland Kitchen losartan (COZAAR) 25 MG tablet Take 25 mg by mouth daily.  . meloxicam (MOBIC) 7.5 MG tablet     BP (!) 166/88   Pulse (!) 102   Temp (!) 96.6 F (35.9 C)   Ht 5' 2"  (1.575 m)   Wt 240 lb (108.9 kg)   BMI 43.90 kg/m   Physical Exam Vitals signs and nursing note reviewed.  Constitutional:      General: She is not in acute distress.    Appearance: Normal appearance. She is obese. She is not ill-appearing, toxic-appearing or diaphoretic.  Musculoskeletal:     Right knee: She exhibits no effusion.     Left knee: She exhibits no effusion.     Right lower leg: 1+ Edema present.     Left lower leg: 1+ Edema present.  Skin:    Comments: Both lower extremities exhibit chronic venous stasis skin pigment changes with bilateral pitting edema approximately midshaft grade 1  Neurological:     Mental Status: She is alert and oriented to person, place, and time.  Psychiatric:        Mood and Affect: Mood normal.     Right Knee Exam   Muscle Strength  The patient has normal right knee strength.  Tenderness  The patient is experiencing tenderness in the medial joint line and lateral joint line.  Range of Motion  Extension: normal  Flexion: normal   Tests  McMurray:  Medial - negative Lateral - negative Varus: negative Valgus: negative Drawer:  Anterior - negative    Posterior - negative  Other  Erythema: absent Scars: absent Sensation: normal Pulse: present Swelling: none Effusion: no effusion present   Left Knee Exam   Muscle Strength  The patient has normal left knee strength.  Tenderness  The patient is experiencing tenderness in the lateral joint line and medial  joint line.  Range of Motion  Extension: normal  Flexion: normal   Tests  McMurray:  Medial - negative Lateral - negative Varus: negative Valgus: negative Drawer:  Anterior - negative     Posterior - negative  Other  Erythema: absent Scars: absent Sensation: normal Pulse: present Swelling: none Effusion: no effusion present        MEDICAL DECISION SECTION  Xrays were done at [facility]  My independent reading of xrays:  Show bilateral knee arthritis medial compartment with joint space narrowing medially and some secondary bone changes The patient meets the AMA guidelines for Morbid (severe) obesity with a BMI > 40.0 and I have recommended weight loss.  Encounter Diagnoses  Name Primary?  . Chronic pain of left knee Yes  . Chronic pain of right knee   . Primary osteoarthritis of both knees     PLAN: (Rx., injectx, surgery, frx, mri/ct) Continue meloxicam  Bilateral knee injection  Procedure note for bilateral knee injections  Procedure note left knee injection verbal consent was obtained to inject left knee joint  Timeout was completed to confirm the site of injection  The medications used were 40 mg of Depo-Medrol and 1% lidocaine 3 cc  Anesthesia was provided by ethyl chloride and the skin was prepped with alcohol.  After cleaning the skin with alcohol a 20-gauge needle was used to inject the left knee joint. There were no complications. A sterile bandage was applied.   Procedure note right knee injection verbal consent was obtained to inject right knee joint  Timeout was completed to confirm the site of injection  The medications used were 40 mg of Depo-Medrol and 1% lidocaine 3 cc  Anesthesia was provided by ethyl chloride and the skin was prepped with alcohol.  After cleaning the skin with alcohol a 20-gauge needle was used to inject the right knee joint. There were no complications. A sterile bandage was applied.   3 months  recheck  Advised weight loss  No orders of the defined types were placed in this encounter.   Arther Abbott, MD  07/06/2018 11:33 AM

## 2018-10-05 ENCOUNTER — Encounter: Payer: Self-pay | Admitting: Orthopedic Surgery

## 2018-10-05 ENCOUNTER — Other Ambulatory Visit: Payer: Self-pay

## 2018-10-05 ENCOUNTER — Ambulatory Visit: Payer: BC Managed Care – PPO | Admitting: Orthopedic Surgery

## 2018-10-05 VITALS — BP 150/87 | HR 102 | Temp 97.1°F | Ht 62.0 in | Wt 236.0 lb

## 2018-10-05 DIAGNOSIS — G8929 Other chronic pain: Secondary | ICD-10-CM

## 2018-10-05 DIAGNOSIS — M170 Bilateral primary osteoarthritis of knee: Secondary | ICD-10-CM

## 2018-10-05 DIAGNOSIS — M25562 Pain in left knee: Secondary | ICD-10-CM

## 2018-10-05 DIAGNOSIS — M25561 Pain in right knee: Secondary | ICD-10-CM | POA: Diagnosis not present

## 2018-10-05 MED ORDER — MELOXICAM 7.5 MG PO TABS: 7.5000 mg | ORAL_TABLET | Freq: Every day | ORAL | 5 refills | Status: DC

## 2018-10-05 NOTE — Progress Notes (Signed)
Chief Complaint  Patient presents with  . Follow-up    Recheck on knees.    Injections requested   Procedure note for bilateral knee injections  Procedure note left knee injection verbal consent was obtained to inject left knee joint  Timeout was completed to confirm the site of injection  The medications used were 40 mg of Depo-Medrol and 1% lidocaine 3 cc  Anesthesia was provided by ethyl chloride and the skin was prepped with alcohol.  After cleaning the skin with alcohol a 20-gauge needle was used to inject the left knee joint. There were no complications. A sterile bandage was applied.   Procedure note right knee injection verbal consent was obtained to inject right knee joint  Timeout was completed to confirm the site of injection  The medications used were 40 mg of Depo-Medrol and 1% lidocaine 3 cc  Anesthesia was provided by ethyl chloride and the skin was prepped with alcohol.  After cleaning the skin with alcohol a 20-gauge needle was used to inject the right knee joint. There were no complications. A sterile bandage was applied.   Encounter Diagnoses  Name Primary?  . Chronic pain of left knee Yes  . Chronic pain of right knee   . Primary osteoarthritis of both knees    FU 3 MONTHS   Meds ordered this encounter  Medications  . meloxicam (MOBIC) 7.5 MG tablet    Sig: Take 1 tablet (7.5 mg total) by mouth daily.    Dispense:  30 tablet    Refill:  5

## 2018-10-05 NOTE — Patient Instructions (Signed)
Weight loss  Meds ordered this encounter  Medications  . meloxicam (MOBIC) 7.5 MG tablet    Sig: Take 1 tablet (7.5 mg total) by mouth daily.    Dispense:  30 tablet    Refill:  5    You have received an injection of steroids into the joint. 15% of patients will have increased pain within the 24 hours postinjection.   This is transient and will go away.   We recommend that you use ice packs on the injection site for 20 minutes every 2 hours and extra strength Tylenol 2 tablets every 8 as needed until the pain resolves.  If you continue to have pain after taking the Tylenol and using the ice please call the office for further instructions.

## 2018-12-22 ENCOUNTER — Other Ambulatory Visit (HOSPITAL_COMMUNITY): Payer: Self-pay | Admitting: Internal Medicine

## 2018-12-22 DIAGNOSIS — Z1231 Encounter for screening mammogram for malignant neoplasm of breast: Secondary | ICD-10-CM

## 2019-01-04 ENCOUNTER — Ambulatory Visit: Payer: BC Managed Care – PPO | Admitting: Orthopedic Surgery

## 2019-01-04 ENCOUNTER — Ambulatory Visit (HOSPITAL_COMMUNITY): Payer: BC Managed Care – PPO

## 2019-01-18 ENCOUNTER — Ambulatory Visit: Payer: BC Managed Care – PPO | Admitting: Orthopedic Surgery

## 2019-01-26 ENCOUNTER — Ambulatory Visit (HOSPITAL_COMMUNITY): Payer: BC Managed Care – PPO

## 2019-03-24 ENCOUNTER — Other Ambulatory Visit: Payer: Self-pay | Admitting: Orthopedic Surgery

## 2019-04-17 ENCOUNTER — Ambulatory Visit: Payer: Self-pay | Attending: Internal Medicine

## 2019-04-17 DIAGNOSIS — Z23 Encounter for immunization: Secondary | ICD-10-CM

## 2019-04-17 NOTE — Progress Notes (Signed)
   Covid-19 Vaccination Clinic  Name:  Megan Maldonado    MRN: [id number] DOB: 1960-07-09  04/17/2019  Megan Maldonado was observed post Covid-19 immunization for 15 minutes without incident. She was provided with Vaccine Information Sheet and instruction to access the V-Safe system.   Megan Maldonado was instructed to call 911 with any severe reactions post vaccine: Marland Kitchen Difficulty breathing  . Swelling of face and throat  . A fast heartbeat  . A bad rash all over body  . Dizziness and weakness   Immunizations Administered    Name Date Dose VIS Date Route   Pfizer COVID-19 Vaccine 04/17/2019  1:10 PM 0.3 mL 12/30/2018 Intramuscular   Manufacturer: [place]   Lot: [id number]   [place]: [id number]

## 2019-05-09 ENCOUNTER — Ambulatory Visit: Payer: Self-pay | Attending: Internal Medicine

## 2019-05-09 DIAGNOSIS — Z23 Encounter for immunization: Secondary | ICD-10-CM

## 2019-05-09 NOTE — Progress Notes (Signed)
   Covid-19 Vaccination Clinic  Name:  DAJANA GEHRIG    MRN: [id number] DOB: March 08, 1960  05/09/2019  Ms. Ancrum was observed post Covid-19 immunization for 15 minutes without incident. She was provided with Vaccine Information Sheet and instruction to access the V-Safe system.   Ms. Adelstein was instructed to call 911 with any severe reactions post vaccine: Marland Kitchen Difficulty breathing  . Swelling of face and throat  . A fast heartbeat  . A bad rash all over body  . Dizziness and weakness   Immunizations Administered    Name Date Dose VIS Date Route   Pfizer COVID-19 Vaccine 05/09/2019  1:32 PM 0.3 mL 03/15/2018 Intramuscular   Manufacturer: [place]   Lot: [id number]   [place]: [address]-7

## 2019-11-20 ENCOUNTER — Encounter: Payer: Self-pay | Admitting: Orthopedic Surgery

## 2019-11-20 ENCOUNTER — Other Ambulatory Visit: Payer: Self-pay

## 2019-11-20 ENCOUNTER — Ambulatory Visit: Payer: 59

## 2019-11-20 ENCOUNTER — Ambulatory Visit (INDEPENDENT_AMBULATORY_CARE_PROVIDER_SITE_OTHER): Payer: 59 | Admitting: Orthopedic Surgery

## 2019-11-20 VITALS — BP 164/99 | HR 89 | Ht 62.0 in | Wt 214.0 lb

## 2019-11-20 DIAGNOSIS — G8929 Other chronic pain: Secondary | ICD-10-CM

## 2019-11-20 DIAGNOSIS — M25561 Pain in right knee: Secondary | ICD-10-CM | POA: Diagnosis not present

## 2019-11-20 NOTE — Patient Instructions (Signed)
Continue as managed for knee   Make a ppt for the ankle

## 2019-11-20 NOTE — Progress Notes (Signed)
Chief Complaint  Patient presents with  . Knee Pain    right knee pain, dr Gerarda Fraction is concerned about the right knee, has some pain,    Body mass index is 39.14 kg/m. BP (!) 164/99   Pulse 89   Ht 5' 2"  (1.575 m)   Wt 214 lb (97.1 kg)   BMI 39.14 kg/m   Recommend continue meloxicam and hydrocodone as she is taking continue efforts at weight loss which she has done follow-up with [place] on an as-needed basis no concerns regarding the crepitance in the right knee  HPI: 59 years old female just had knee evaluation in June 2 injections for osteoarthritis x-rays at that time did not show disease severe enough to warrant knee replacement. She was at her primary care doctor's office and he was concerned about crepitance in her right knee and sent her here for reevaluation  She complains of intermittent pain when she walks she is on hydrocodone and anti-inflammatory meloxicam which seems to be controlling her symptoms  She walks unsupported she has mild tenderness on the medial joint line she does have crepitance on range of motion her range of motion is unrestricted her ligaments are stable muscle tone is normal  Minimal peripheral edema and neurovascular exam is intact  Today's x-ray shows a 3 to 4 degree valgus knee mild narrowing of the medial compartment without secondary bone changes   IMAGING   CT 2015 IMPRESSION:  1. Progressed osteopenia since 2010, but continued solid arthrodesis  suspected at the L3-L4 and L4-L5 levels, with no adverse features.  2. 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 Up to mild spinal stenosis now suspected with mild to  moderate left greater than right L2 foraminal stenosis.    Electronically Signed  By: Lars Pinks M.D.  On: 09/22/2013 13:27

## 2019-12-18 ENCOUNTER — Ambulatory Visit: Payer: 59 | Admitting: Orthopedic Surgery

## 2019-12-18 ENCOUNTER — Other Ambulatory Visit: Payer: Self-pay

## 2019-12-18 ENCOUNTER — Ambulatory Visit: Payer: 59

## 2019-12-18 ENCOUNTER — Encounter: Payer: Self-pay | Admitting: Orthopedic Surgery

## 2019-12-18 VITALS — BP 129/81 | HR 117 | Ht 62.0 in | Wt 214.0 lb

## 2019-12-18 DIAGNOSIS — M25572 Pain in left ankle and joints of left foot: Secondary | ICD-10-CM | POA: Diagnosis not present

## 2019-12-18 DIAGNOSIS — M7672 Peroneal tendinitis, left leg: Secondary | ICD-10-CM | POA: Diagnosis not present

## 2019-12-18 NOTE — Progress Notes (Signed)
Chief Complaint  Patient presents with  . Ankle Pain    left ankle pain    59 year old female presents with atraumatic onset of posterior lateral inferior left ankle pain no real trauma.  She said a month or so ago she crossed her legs felt acute pain right at the side of her left ankle and then has had trouble weightbearing despite wearing a brace  She presents for evaluation and treatment  Review of systems she does not appear to have any weakness other than trouble weightbearing there is no giving out there is no numbness or tingling no skin rash erythema fever or chills  Past Medical History:  Diagnosis Date  . Anxiety   . Arthritis   . Chronic back pain   . HTN (hypertension)     BP 129/81   Pulse (!) 117   Ht 5' 2"  (1.575 m)   Wt 214 lb (97.1 kg)   BMI 39.14 kg/m   She is awake alert she is oriented x3 mood is pleasant affect is normal today she is walking without noticeable limp  Left ankle is swollen and tender in the posterior area of the ankle posterior to the lateral malleolus in the area of the superficial peroneal retinaculum she does have good strength there is no tenderness over the ATFL drawer test is normal Achilles is nontender medial side is negative for tenderness or swelling  X-rays negative  Encounter Diagnoses  Name Primary?  . Pain in left ankle and joints of left foot   . Peroneal tendinitis of lower leg, left Yes   Assessment and plan  We are going to institute acute course of physical therapy with bracing if after 6 weeks she does not improve we can do an MRI to diagnose tendinitis versus tendon rupture versus retinacular rupture

## 2020-01-02 ENCOUNTER — Other Ambulatory Visit: Payer: Self-pay

## 2020-01-02 ENCOUNTER — Ambulatory Visit (HOSPITAL_COMMUNITY): Payer: 59 | Attending: Orthopedic Surgery | Admitting: Physical Therapy

## 2020-01-02 ENCOUNTER — Encounter (HOSPITAL_COMMUNITY): Payer: Self-pay | Admitting: Physical Therapy

## 2020-01-02 DIAGNOSIS — R2689 Other abnormalities of gait and mobility: Secondary | ICD-10-CM | POA: Insufficient documentation

## 2020-01-02 DIAGNOSIS — M6281 Muscle weakness (generalized): Secondary | ICD-10-CM | POA: Insufficient documentation

## 2020-01-02 DIAGNOSIS — M25572 Pain in left ankle and joints of left foot: Secondary | ICD-10-CM | POA: Insufficient documentation

## 2020-01-02 DIAGNOSIS — R29898 Other symptoms and signs involving the musculoskeletal system: Secondary | ICD-10-CM | POA: Insufficient documentation

## 2020-01-02 NOTE — Therapy (Signed)
[facility] [place], [place], [address] Phone: [phone]   Fax:  [phone]  Physical Therapy Evaluation  Patient Details  Name: Megan Maldonado MRN: [id number] Date of Birth: [birth year] Referring Provider (PT): Arther Abbott MD   Encounter Date: 01/02/2020   PT End of Session - 01/02/20 1531    Visit Number 1    Number of Visits 12    Date for PT Re-Evaluation 02/13/20    Authorization Type [employer] (auth yes, vl 30)    Authorization - Visit Number 1    Authorization - Number of Visits 30    Progress Note Due on Visit 10    PT Start Time 1450    PT Stop Time 1528    PT Time Calculation (min) 38 min    Activity Tolerance Patient tolerated treatment well    Behavior During Therapy [facility] for tasks assessed/performed           Past Medical History:  Diagnosis Date  . Anxiety   . Arthritis   . Chronic back pain   . HTN (hypertension)     Past Surgical History:  Procedure Laterality Date  . BACK SURGERY  2008  . CARPAL TUNNEL RELEASE Bilateral 2002  . COLONOSCOPY WITH PROPOFOL N/A 10/04/2017   Procedure: COLONOSCOPY WITH PROPOFOL;  Surgeon: Daneil Dolin, MD;  [facility];  Service: Endoscopy;  Laterality: N/A;  10:30am  . POLYPECTOMY  10/04/2017   Procedure: POLYPECTOMY;  Surgeon: Daneil Dolin, MD;  [facility];  Service: Endoscopy;;  rectal  . TONSILLECTOMY      There were no vitals filed for this visit.    Subjective Assessment - 01/02/20 1450    Subjective Patient is a 59 y.o. female who presents to physical therapy with c/o L foot/ankle pain. Patient states she went to physical therapy for her back years ago and had to have surgery so cant lay flat on stomach or back. She states about 1 year ago she crossed her feet at the ankles and then had sharp ankle pain. She has pain with crossing her legs and weightbearing. She uses a ankle sleeve for pain and support. Her symptoms  are not constant. She can go for weeks without symptoms and then it just comes on all of a sudden. Sometimes it will hurt for days. Patient states her main goal is for her ankle not to have anything torn so she can avoid surgery. she has lateral foot numbess that she just noticed on the plantar aspects of her foot.    Limitations Walking;House hold activities    How long can you walk comfortably? unrestricted    Patient Stated Goals ankle not to have anything torn so she can avoid surgery    Currently in Pain? No/denies              [facility] PT Assessment - 01/02/20 0001      Assessment   Medical Diagnosis L ankle/foot pain    Referring Provider (PT) Arther Abbott MD    Onset Date/Surgical Date 12/20/18    Next MD Visit 1/13 maybe    Prior Therapy Back      Precautions   Precautions None      Restrictions   Weight Bearing Restrictions No      Balance Screen   Has the patient fallen in the past 6 months No    Has the patient had a decrease in activity level  because of a fear of falling?  No    Is the patient reluctant to leave their home because of a fear of falling?  No      Prior Function   Level of Independence Independent    Vocation Unemployed      Cognition   Overall Cognitive Status Within Functional Limits for tasks assessed      Observation/Other Assessments   Observations Patient ambulates without AD    Focus on Therapeutic Outcomes (FOTO)  complete next session      ROM / Strength   AROM / PROM / Strength AROM;Strength      AROM   AROM Assessment Site Ankle    Right/Left Ankle Right;Left    Right Ankle Dorsiflexion 0    Right Ankle Plantar Flexion 70    Right Ankle Inversion 32    Right Ankle Eversion 30    Left Ankle Dorsiflexion 7   lacking   Left Ankle Plantar Flexion 59    Left Ankle Inversion 25    Left Ankle Eversion 5      Strength   Overall Strength Comments slight pain in L ankle    Strength Assessment Site Hip;Knee;Ankle    Right/Left  Hip Right;Left    Right Hip Flexion 4+/5    Left Hip Flexion 4+/5    Right/Left Knee Right;Left    Right Knee Flexion 5/5    Right Knee Extension 5/5    Left Knee Flexion 5/5    Left Knee Extension 5/5    Right/Left Ankle Right;Left    Right Ankle Dorsiflexion 5/5    Right Ankle Inversion 5/5    Right Ankle Eversion 5/5    Left Ankle Dorsiflexion 4+/5    Left Ankle Inversion 4+/5    Left Ankle Eversion 4+/5      Palpation   Palpation comment TTP L ankle posterior/inferior to lateral malleolus, no other tenderness throughout foot/ankle, minimal/no edema, hypmobile talocrual AP on L , R WFL      Ambulation/Gait   Ambulation/Gait Yes    Ambulation/Gait Assistance 7: Independent    Ambulation Distance (Feet) 375 Feet    Assistive device None    Gait Pattern --   decreased L ankle DF, early heel off, decreased hip extension   Ambulation Surface Level;Indoor    Gait Comments 2 MWT, slight pain in L ankle with turning corners                      Objective measurements completed on examination: See above findings.               PT Education - 01/02/20 1449    Education Details Patient educated on exam findings, POC, scope of PT, moving L ankle with pumps/circles    Person(s) Educated Patient    Methods Explanation;Demonstration    Comprehension Verbalized understanding;Returned demonstration            PT Short Term Goals - 01/02/20 1618      PT SHORT TERM GOAL #1   Title Patient will be independent with HEP in order to improve functional outcomes.    Time 3    Period Weeks    Status New    Target Date 01/23/20      PT SHORT TERM GOAL #2   Title Patient will report at least 25% improvement in symptoms for improved quality of life.    Time 3    Period Weeks    Status New  Target Date 01/23/20             PT Long Term Goals - 01/02/20 1619      PT LONG TERM GOAL #1   Title Patient will report at least 75% improvement in symptoms for  improved quality of life.    Time 6    Period Weeks    Status New    Target Date 02/13/20      PT LONG TERM GOAL #2   Title Patient will be able to ambulate at least 400 feet in 2MWT in order to demonstrate improved gait speed for community ambulation.    Time 6    Period Weeks    Status New    Target Date 02/13/20      PT LONG TERM GOAL #3   Title Patient will improve L ankle DF ROM to 0 degrees for improved gait mechanics.    Time 6    Period Weeks    Status New    Target Date 02/13/20                  Plan - 01/02/20 1552    Clinical Impression Statement Patient is a 59 y.o. female who presents to physical therapy with c/o L foot/ankle pain.  She presents with pain limited deficits in L foot/ankle strength, ROM, endurance, gait, and functional mobility with ADL. She is having to modify and restrict ADL as indicated by subjective information and objective measures which is affecting overall participation. Patient will benefit from skilled physical therapy in order to improve function and reduce impairment.    Personal Factors and Comorbidities Fitness;Time since onset of injury/illness/exacerbation;Past/Current Experience;Age    Examination-Activity Limitations Locomotion Level;Stairs;Squat;Bend    Examination-Participation Restrictions Lawyer;Shop;Meal Prep;Community Activity;Cleaning    Stability/Clinical Decision Making Stable/Uncomplicated    Clinical Decision Making Low    Rehab Potential Good    PT Frequency 2x / week    PT Duration 6 weeks    PT Treatment/Interventions ADLs/Self Care Home Management;Aquatic Therapy;Cryotherapy;Electrical Stimulation;Iontophoresis 29m/ml Dexamethasone;Moist Heat;Traction;Ultrasound;DME Instruction;Gait training;Stair training;Functional mobility training;Therapeutic activities;Therapeutic exercise;Balance training;Neuromuscular re-education;Patient/family education;Manual techniques;Scar mobilization;Passive range of  motion;Compression bandaging;Dry needling;Energy conservation;Splinting;Taping;Vasopneumatic Device;Spinal Manipulations;Joint Manipulations    PT Next Visit Plan begin L ankle mobility exercises, improve L ankle strength, begin balance exercise and progress all as able    PT Home Exercise Plan ankle pumps    Consulted and Agree with Plan of Care Patient           Patient will benefit from skilled therapeutic intervention in order to improve the following deficits and impairments:  Abnormal gait,Decreased range of motion,Difficulty walking,Decreased activity tolerance,Pain,Decreased balance,Impaired flexibility,Improper body mechanics,Decreased mobility,Decreased strength,Increased edema  Visit Diagnosis: Pain in left ankle and joints of left foot  Muscle weakness (generalized)  Other abnormalities of gait and mobility  Other symptoms and signs involving the musculoskeletal system     Problem List Patient Active Problem List   Diagnosis Date Noted  . Encounter for screening colonoscopy 08/19/2017  . Gluten intolerance 08/19/2017    4:22 PM, 01/02/20 AMearl LatinPT, DPT [facility] [address]Phone: [phone]  Fax:  [phone] Name: Megan DORFFMRN: [id number] of Birth: 701/10/1960

## 2020-01-03 ENCOUNTER — Encounter (HOSPITAL_COMMUNITY): Payer: Self-pay

## 2020-01-03 ENCOUNTER — Ambulatory Visit (HOSPITAL_COMMUNITY): Payer: 59

## 2020-01-03 DIAGNOSIS — M25572 Pain in left ankle and joints of left foot: Secondary | ICD-10-CM | POA: Diagnosis not present

## 2020-01-03 DIAGNOSIS — R2689 Other abnormalities of gait and mobility: Secondary | ICD-10-CM

## 2020-01-03 DIAGNOSIS — R29898 Other symptoms and signs involving the musculoskeletal system: Secondary | ICD-10-CM

## 2020-01-03 DIAGNOSIS — M6281 Muscle weakness (generalized): Secondary | ICD-10-CM

## 2020-01-03 NOTE — Therapy (Signed)
[place], [place], [address] Phone: [phone]   Fax:  [phone]  Physical Therapy Treatment  Patient Details  Name: Megan Maldonado MRN: [id number] Date of Birth: [birth year] Referring Provider (PT): Arther Abbott MD   Encounter Date: 01/03/2020   PT End of Session - 01/03/20 1449    Visit Number 2    Number of Visits 12    Date for PT Re-Evaluation 02/13/20    Authorization Type [employer] (auth yes, vl 30)    Authorization - Visit Number 2    Authorization - Number of Visits 30    Progress Note Due on Visit 10    PT Start Time 6962    PT Stop Time 1525    PT Time Calculation (min) 43 min    Activity Tolerance Patient tolerated treatment well    Behavior During Therapy [facility] for tasks assessed/performed           Past Medical History:  Diagnosis Date  . Anxiety   . Arthritis   . Chronic back pain   . HTN (hypertension)     Past Surgical History:  Procedure Laterality Date  . BACK SURGERY  2008  . CARPAL TUNNEL RELEASE Bilateral 2002  . COLONOSCOPY WITH PROPOFOL N/A 10/04/2017   Procedure: COLONOSCOPY WITH PROPOFOL;  Surgeon: Daneil Dolin, MD;  [facility];  Service: Endoscopy;  Laterality: N/A;  10:30am  . POLYPECTOMY  10/04/2017   Procedure: POLYPECTOMY;  Surgeon: Daneil Dolin, MD;  [facility];  Service: Endoscopy;;  rectal  . TONSILLECTOMY      There were no vitals filed for this visit.   Subjective Assessment - 01/03/20 1446    Subjective Pt reports ankle is feeling good today.  Reports she has been compliant with HEP and movement feels good.  Reports increased swelling yesterday.    Patient Stated Goals ankle not to have anything torn so she can avoid surgery    Currently in Pain? Yes    Pain Score 1     Pain Location Ankle    Pain Orientation Left    Pain Descriptors / Indicators Aching;Dull    Pain Onset More than a month ago    Pain Frequency  Constant    Aggravating Factors  unsure    Pain Relieving Factors resting, getting off it, ice                             [facility] Adult PT Treatment/Exercise - 01/03/20 0001      Exercises   Exercises Ankle      Ankle Exercises: Stretches   Slant Board Stretch 3 reps;30 seconds      Ankle Exercises: Seated   Ankle Circles/Pumps 10 reps;Left    Towel Crunch 2 reps    Towel Inversion/Eversion 3 reps    Heel Raises 10 reps    Toe Raise 10 reps    BAPS Sitting;Level 2;10 reps    BAPS Limitations DF/PF; Inv/Ev; CW/CCW    Other Seated Ankle Exercises arch formation with toe extension                  PT Education - 01/03/20 1456    Education Details Reviewed goals, educated importance of HEP compliance for maximal benefits, pt able to recall and demonstrate appropriate mechanics.  Educated benefits of compression hose to assist with edema- pt not interested in  measurements today but may later.    Person(s) Educated Patient    Methods Explanation            PT Short Term Goals - 01/02/20 1618      PT SHORT TERM GOAL #1   Title Patient will be independent with HEP in order to improve functional outcomes.    Time 3    Period Weeks    Status New    Target Date 01/23/20      PT SHORT TERM GOAL #2   Title Patient will report at least 25% improvement in symptoms for improved quality of life.    Time 3    Period Weeks    Status New    Target Date 01/23/20             PT Long Term Goals - 01/02/20 1619      PT LONG TERM GOAL #1   Title Patient will report at least 75% improvement in symptoms for improved quality of life.    Time 6    Period Weeks    Status New    Target Date 02/13/20      PT LONG TERM GOAL #2   Title Patient will be able to ambulate at least 400 feet in 2MWT in order to demonstrate improved gait speed for community ambulation.    Time 6    Period Weeks    Status New    Target Date 02/13/20      PT LONG TERM GOAL #3    Title Patient will improve L ankle DF ROM to 0 degrees for improved gait mechanics.    Time 6    Period Weeks    Status New    Target Date 02/13/20                 Plan - 01/03/20 1504    Clinical Impression Statement Reviewed goals, educated importance of HEP compliance, pt able to recall and demonstrate appropriate mechanics for mobility ankle exercise.  Pt with edema present lateral aspect of ankle and some pain at end range with eversion movements.  Educated benefits with compression garments for edema control, pt not interested in measurement/paperwork today but may in future.  Session focus on ankle mobility and strenghtening. Noted decreased arch formation, educated on anatomy and included intrinsic strengthening exercises, stretches and strengthening.  Additional exercises given for intrinsic strengthening and mobility, printout given with ability to verbalized and demonstrate.  Therapist required to reduce compensation with knee movements during [employer].  No reports of pain at EOS.    Personal Factors and Comorbidities Fitness;Time since onset of injury/illness/exacerbation;Past/Current Experience;Age    Examination-Activity Limitations Locomotion Level;Stairs;Squat;Bend    Examination-Participation Restrictions Lawyer;Shop;Meal Prep;Community Activity;Cleaning    Stability/Clinical Decision Making Stable/Uncomplicated    Clinical Decision Making Low    Rehab Potential Good    PT Frequency 2x / week    PT Duration 6 weeks    PT Treatment/Interventions ADLs/Self Care Home Management;Aquatic Therapy;Cryotherapy;Electrical Stimulation;Iontophoresis 81m/ml Dexamethasone;Moist Heat;Traction;Ultrasound;DME Instruction;Gait training;Stair training;Functional mobility training;Therapeutic activities;Therapeutic exercise;Balance training;Neuromuscular re-education;Patient/family education;Manual techniques;Scar mobilization;Passive range of motion;Compression bandaging;Dry  needling;Energy conservation;Splinting;Taping;Vasopneumatic Device;Spinal Manipulations;Joint Manipulations    PT Next Visit Plan Continued ankle mobility, Lt ankle strengthening and begin balance exercises.  Next session begin standing heel/toe raises and tandem stance    PT Home Exercise Plan ankle pumps; 12/15: towel crunch and inversion/eversion.           Patient will benefit from skilled therapeutic intervention in  order to improve the following deficits and impairments:  Abnormal gait,Decreased range of motion,Difficulty walking,Decreased activity tolerance,Pain,Decreased balance,Impaired flexibility,Improper body mechanics,Decreased mobility,Decreased strength,Increased edema  Visit Diagnosis: Pain in left ankle and joints of left foot  Muscle weakness (generalized)  Other abnormalities of gait and mobility  Other symptoms and signs involving the musculoskeletal system     Problem List Patient Active Problem List   Diagnosis Date Noted  . Encounter for screening colonoscopy 08/19/2017  . Gluten intolerance 08/19/2017   Ihor Austin, LPTA/CLT; CBIS [phone]  Aldona Lento 01/03/2020, 3:31 PM  [place] [address] [place], [place], [address] Phone: [phone]   Fax:  [phone]  Name: Megan Maldonado MRN: [id number] Date of Birth: May 27, 1960

## 2020-01-09 ENCOUNTER — Ambulatory Visit (HOSPITAL_COMMUNITY): Payer: 59 | Admitting: Physical Therapy

## 2020-01-09 ENCOUNTER — Other Ambulatory Visit: Payer: Self-pay

## 2020-01-09 DIAGNOSIS — R2689 Other abnormalities of gait and mobility: Secondary | ICD-10-CM

## 2020-01-09 DIAGNOSIS — M25572 Pain in left ankle and joints of left foot: Secondary | ICD-10-CM

## 2020-01-09 DIAGNOSIS — M6281 Muscle weakness (generalized): Secondary | ICD-10-CM

## 2020-01-09 DIAGNOSIS — R29898 Other symptoms and signs involving the musculoskeletal system: Secondary | ICD-10-CM

## 2020-01-09 NOTE — Therapy (Signed)
[place], [place], [address] Phone: [phone]   Fax:  [phone]  Physical Therapy Treatment  Patient Details  Name: Megan Maldonado MRN: [id number] Date of Birth: 09-Jun-1960 Referring Provider (PT): Arther Abbott MD   Encounter Date: 01/09/2020   PT End of Session - 01/09/20 1634    Visit Number 3    Number of Visits 12    Date for PT Re-Evaluation 02/13/20    Authorization Type [employer] (auth yes, vl 30)    Authorization - Visit Number 3    Authorization - Number of Visits 30    Progress Note Due on Visit 10    PT Start Time 1538    PT Stop Time 1616    PT Time Calculation (min) 38 min    Activity Tolerance Patient tolerated treatment well    Behavior During Therapy [facility] for tasks assessed/performed           Past Medical History:  Diagnosis Date   Anxiety    Arthritis    Chronic back pain    HTN (hypertension)     Past Surgical History:  Procedure Laterality Date   BACK SURGERY  2008   CARPAL TUNNEL RELEASE Bilateral 2002   COLONOSCOPY WITH PROPOFOL N/A 10/04/2017   Procedure: COLONOSCOPY WITH PROPOFOL;  Surgeon: Daneil Dolin, MD;  [facility];  Service: Endoscopy;  Laterality: N/A;  10:30am   POLYPECTOMY  10/04/2017   Procedure: POLYPECTOMY;  Surgeon: Daneil Dolin, MD;  [facility];  Service: Endoscopy;;  rectal   TONSILLECTOMY      There were no vitals filed for this visit.   Subjective Assessment - 01/09/20 1609    Subjective Pt states she fell Sunday after slipping up in the wet grass and has a little body soreness.  STates she has not had any pain in her ankle.  Reports  complaince with HEP .    Currently in Pain? No/denies                             [facility] Adult PT Treatment/Exercise - 01/09/20 0001      Ankle Exercises: Standing   BAPS Level 2;Standing;10 reps;Limitations    BAPS Limitations completled with bil UE's     Vector Stance Right;Left;5 reps;5 seconds    SLS 3X max of  15" Rt, 8" Lt    Heel Raises Both;10 reps    Toe Raise 10 reps    Other Standing Ankle Exercises tandem on foam 30" no challenge      Ankle Exercises: Stretches   Plantar Fascia Stretch 3 reps;30 seconds    Slant Board Stretch 3 reps;30 seconds      Ankle Exercises: Seated   BAPS Sitting;Level 3                    PT Short Term Goals - 01/02/20 1618      PT SHORT TERM GOAL #1   Title Patient will be independent with HEP in order to improve functional outcomes.    Time 3    Period Weeks    Status New    Target Date 01/23/20      PT SHORT TERM GOAL #2   Title Patient will report at least 25% improvement in symptoms for improved quality of life.    Time 3    Period Weeks  Status New    Target Date 01/23/20             PT Long Term Goals - 01/02/20 1619      PT LONG TERM GOAL #1   Title Patient will report at least 75% improvement in symptoms for improved quality of life.    Time 6    Period Weeks    Status New    Target Date 02/13/20      PT LONG TERM GOAL #2   Title Patient will be able to ambulate at least 400 feet in 2MWT in order to demonstrate improved gait speed for community ambulation.    Time 6    Period Weeks    Status New    Target Date 02/13/20      PT LONG TERM GOAL #3   Title Patient will improve L ankle DF ROM to 0 degrees for improved gait mechanics.    Time 6    Period Weeks    Status New    Target Date 02/13/20                 Plan - 01/09/20 1633    Clinical Impression Statement Pt returns today pleased with being painfree.  Began standing exercises this session to increase challenge.  Pt able to maintain static tandem balance on foam for full 30 without difficulty.  Single leg stance was more challenging for patient on foam.  Added vector stance to further improve LE stability.  Progressed BAPS to standing with overall good control of ankle mm.  No soreness  or pain reported at end of session today.    Personal Factors and Comorbidities Fitness;Time since onset of injury/illness/exacerbation;Past/Current Experience;Age    Examination-Activity Limitations Locomotion Level;Stairs;Squat;Bend    Examination-Participation Restrictions Lawyer;Shop;Meal Prep;Community Activity;Cleaning    Stability/Clinical Decision Making Stable/Uncomplicated    Rehab Potential Good    PT Frequency 2x / week    PT Duration 6 weeks    PT Treatment/Interventions ADLs/Self Care Home Management;Aquatic Therapy;Cryotherapy;Electrical Stimulation;Iontophoresis 28m/ml Dexamethasone;Moist Heat;Traction;Ultrasound;DME Instruction;Gait training;Stair training;Functional mobility training;Therapeutic activities;Therapeutic exercise;Balance training;Neuromuscular re-education;Patient/family education;Manual techniques;Scar mobilization;Passive range of motion;Compression bandaging;Dry needling;Energy conservation;Splinting;Taping;Vasopneumatic Device;Spinal Manipulations;Joint Manipulations    PT Next Visit Plan progress standing exercises.  Add tband inv/eversion for HEP    PT Home Exercise Plan ankle pumps; 12/15: towel crunch and inversion/eversion.           Patient will benefit from skilled therapeutic intervention in order to improve the following deficits and impairments:  Abnormal gait,Decreased range of motion,Difficulty walking,Decreased activity tolerance,Pain,Decreased balance,Impaired flexibility,Improper body mechanics,Decreased mobility,Decreased strength,Increased edema  Visit Diagnosis: Pain in left ankle and joints of left foot  Muscle weakness (generalized)  Other abnormalities of gait and mobility  Other symptoms and signs involving the musculoskeletal system     Problem List Patient Active Problem List   Diagnosis Date Noted   Encounter for screening colonoscopy 08/19/2017   Gluten intolerance 08/19/2017   Megan Maldonado  PTA/CLT [phone] FTeena Irani12/21/2021, 4:36 PM  [id number] [address]Phone: [phone]  Fax:  [phone] Name: Megan TAVANOMRN: [id number] of Birth: 702/10/1960

## 2020-01-10 ENCOUNTER — Ambulatory Visit (HOSPITAL_COMMUNITY): Payer: 59 | Admitting: Physical Therapy

## 2020-01-10 DIAGNOSIS — M25572 Pain in left ankle and joints of left foot: Secondary | ICD-10-CM

## 2020-01-10 DIAGNOSIS — R29898 Other symptoms and signs involving the musculoskeletal system: Secondary | ICD-10-CM

## 2020-01-10 DIAGNOSIS — R2689 Other abnormalities of gait and mobility: Secondary | ICD-10-CM

## 2020-01-10 DIAGNOSIS — M6281 Muscle weakness (generalized): Secondary | ICD-10-CM

## 2020-01-10 NOTE — Therapy (Signed)
[place], [place], [address] Phone: [phone]   Fax:  [phone]  Physical Therapy Treatment  Patient Details  Name: Megan Maldonado MRN: [id number] Date of Birth: 03-28-1960 Referring Provider (PT): Arther Abbott MD   Encounter Date: 01/10/2020   PT End of Session - 01/10/20 1533    Visit Number 4    Number of Visits 12    Date for PT Re-Evaluation 02/13/20    Authorization Type [employer] (auth yes, vl 30)    Authorization - Visit Number 4    Authorization - Number of Visits 30    Progress Note Due on Visit 10    PT Start Time 0037    PT Stop Time 1528    PT Time Calculation (min) 43 min    Activity Tolerance Patient tolerated treatment well    Behavior During Therapy [facility] for tasks assessed/performed           Past Medical History:  Diagnosis Date  . Anxiety   . Arthritis   . Chronic back pain   . HTN (hypertension)     Past Surgical History:  Procedure Laterality Date  . BACK SURGERY  2008  . CARPAL TUNNEL RELEASE Bilateral 2002  . COLONOSCOPY WITH PROPOFOL N/A 10/04/2017   Procedure: COLONOSCOPY WITH PROPOFOL;  Surgeon: Daneil Dolin, MD;  [facility];  Service: Endoscopy;  Laterality: N/A;  10:30am  . POLYPECTOMY  10/04/2017   Procedure: POLYPECTOMY;  Surgeon: Daneil Dolin, MD;  [facility];  Service: Endoscopy;;  rectal  . TONSILLECTOMY      There were no vitals filed for this visit.   Subjective Assessment - 01/10/20 1451    Subjective Pt reports no issues, pain or soreness in her ankle.  STates she does have some "all over" soreness from falling on Sunday.    Currently in Pain? No/denies                             [facility] Adult PT Treatment/Exercise - 01/10/20 0001      Ankle Exercises: Standing   BAPS Standing;10 reps;Limitations;Level 3    BAPS Limitations completed with bil UE's    Vector Stance Right;Left;5 reps;5 seconds     Heel Raises Left;Right;10 reps;Both    Toe Raise 20 reps    Heel Walk (Round Trip) 2RT    Toe Walk (Round Trip) 2RT    Other Standing Ankle Exercises tandem on foam with 1# UE flexion 10X and rotation 10X each LE leading      Ankle Exercises: Stretches   Slant Board Stretch 3 reps;30 seconds      Ankle Exercises: Seated   Other Seated Ankle Exercises tband eversion and inversion RTB 10X                    PT Short Term Goals - 01/02/20 1618      PT SHORT TERM GOAL #1   Title Patient will be independent with HEP in order to improve functional outcomes.    Time 3    Period Weeks    Status New    Target Date 01/23/20      PT SHORT TERM GOAL #2   Title Patient will report at least 25% improvement in symptoms for improved quality of life.    Time 3    Period Weeks    Status New  Target Date 01/23/20             PT Long Term Goals - 01/02/20 1619      PT LONG TERM GOAL #1   Title Patient will report at least 75% improvement in symptoms for improved quality of life.    Time 6    Period Weeks    Status New    Target Date 02/13/20      PT LONG TERM GOAL #2   Title Patient will be able to ambulate at least 400 feet in 2MWT in order to demonstrate improved gait speed for community ambulation.    Time 6    Period Weeks    Status New    Target Date 02/13/20      PT LONG TERM GOAL #3   Title Patient will improve L ankle DF ROM to 0 degrees for improved gait mechanics.    Time 6    Period Weeks    Status New    Target Date 02/13/20                 Plan - 01/10/20 1534    Clinical Impression Statement Added single leg heel raises/heel and toe walking and increased BAPS to level 3 in standing with good control and no pain.  Continued with strengthening, stretching and more single leg stability exercises.   Added UE task with tandem on foam.  Flexion with 1# bar was not difficult, however rotation with 1# bar was challenging.  More challenge with Rt  leading but Lt leading also difficult.  Pt given tband strengthening exercises to add to HEP.    Personal Factors and Comorbidities Fitness;Time since onset of injury/illness/exacerbation;Past/Current Experience;Age    Examination-Activity Limitations Locomotion Level;Stairs;Squat;Bend    Examination-Participation Restrictions Lawyer;Shop;Meal Prep;Community Activity;Cleaning    Stability/Clinical Decision Making Stable/Uncomplicated    Rehab Potential Good    PT Frequency 2x / week    PT Duration 6 weeks    PT Treatment/Interventions ADLs/Self Care Home Management;Aquatic Therapy;Cryotherapy;Electrical Stimulation;Iontophoresis 62m/ml Dexamethasone;Moist Heat;Traction;Ultrasound;DME Instruction;Gait training;Stair training;Functional mobility training;Therapeutic activities;Therapeutic exercise;Balance training;Neuromuscular re-education;Patient/family education;Manual techniques;Scar mobilization;Passive range of motion;Compression bandaging;Dry needling;Energy conservation;Splinting;Taping;Vasopneumatic Device;Spinal Manipulations;Joint Manipulations    PT Next Visit Plan progress standing and stability exercises.    PT Home Exercise Plan ankle pumps; 12/15: towel crunch and inversion/eversion.  12/22:  single heelraises, RTB inversion/eversion           Patient will benefit from skilled therapeutic intervention in order to improve the following deficits and impairments:  Abnormal gait,Decreased range of motion,Difficulty walking,Decreased activity tolerance,Pain,Decreased balance,Impaired flexibility,Improper body mechanics,Decreased mobility,Decreased strength,Increased edema  Visit Diagnosis: Muscle weakness (generalized)  Pain in left ankle and joints of left foot  Other abnormalities of gait and mobility  Other symptoms and signs involving the musculoskeletal system     Problem List Patient Active Problem List   Diagnosis Date Noted  . Encounter for screening  colonoscopy 08/19/2017  . Gluten intolerance 08/19/2017   [name] PTA/CLT [phone] FTeena Irani12/22/2021, 3:35 PM  [id number] [place] [address]Phone: [phone]  Fax:  [phone] Name: Megan NEDDMRN: [id number] of Birth: 702-15-1962

## 2020-01-17 ENCOUNTER — Telehealth (HOSPITAL_COMMUNITY): Payer: Self-pay

## 2020-01-17 ENCOUNTER — Ambulatory Visit (HOSPITAL_COMMUNITY): Payer: 59

## 2020-01-17 NOTE — Telephone Encounter (Signed)
Pt states she was in the same room with someone on Sunday 12/26 and they tested positive today- Patient will cx today 12/29 and 12/30 to be safe. Plans to come in Monday 1/3 if no symptons.

## 2020-01-18 ENCOUNTER — Ambulatory Visit (HOSPITAL_COMMUNITY): Payer: 59 | Admitting: Physical Therapy

## 2020-01-22 ENCOUNTER — Ambulatory Visit (HOSPITAL_COMMUNITY): Payer: 59 | Admitting: Physical Therapy

## 2020-01-24 ENCOUNTER — Ambulatory Visit (HOSPITAL_COMMUNITY): Payer: 59 | Attending: Orthopedic Surgery | Admitting: Physical Therapy

## 2020-01-24 ENCOUNTER — Encounter (HOSPITAL_COMMUNITY): Payer: Self-pay | Admitting: Physical Therapy

## 2020-01-24 ENCOUNTER — Other Ambulatory Visit: Payer: Self-pay

## 2020-01-24 DIAGNOSIS — M25572 Pain in left ankle and joints of left foot: Secondary | ICD-10-CM | POA: Insufficient documentation

## 2020-01-24 DIAGNOSIS — R2689 Other abnormalities of gait and mobility: Secondary | ICD-10-CM | POA: Insufficient documentation

## 2020-01-24 DIAGNOSIS — R29898 Other symptoms and signs involving the musculoskeletal system: Secondary | ICD-10-CM | POA: Insufficient documentation

## 2020-01-24 DIAGNOSIS — M6281 Muscle weakness (generalized): Secondary | ICD-10-CM | POA: Insufficient documentation

## 2020-01-24 NOTE — Therapy (Signed)
[place], [place], [address] Phone: [phone]   Fax:  [phone]  Physical Therapy Treatment  Patient Details  Name: Megan Maldonado MRN: [id number] Date of Birth: [birth year] Referring Provider (PT): Arther Abbott MD   Encounter Date: 01/24/2020   PT End of Session - 01/24/20 1534    Visit Number 5    Number of Visits 12    Date for PT Re-Evaluation 02/13/20    Authorization Type [employer] (auth yes, vl 30)    Authorization - Visit Number 5    Authorization - Number of Visits 30    Progress Note Due on Visit 10    PT Start Time 1534    PT Stop Time 1612    PT Time Calculation (min) 38 min    Activity Tolerance Patient tolerated treatment well    Behavior During Therapy [facility] for tasks assessed/performed           Past Medical History:  Diagnosis Date  . Anxiety   . Arthritis   . Chronic back pain   . HTN (hypertension)     Past Surgical History:  Procedure Laterality Date  . BACK SURGERY  2008  . CARPAL TUNNEL RELEASE Bilateral 2002  . COLONOSCOPY WITH PROPOFOL N/A 10/04/2017   Procedure: COLONOSCOPY WITH PROPOFOL;  Surgeon: Daneil Dolin, MD;  [facility];  Service: Endoscopy;  Laterality: N/A;  10:30am  . POLYPECTOMY  10/04/2017   Procedure: POLYPECTOMY;  Surgeon: Daneil Dolin, MD;  [facility];  Service: Endoscopy;;  rectal  . TONSILLECTOMY      There were no vitals filed for this visit.   Subjective Assessment - 01/24/20 1535    Subjective Patient states that she is doing better. She states she was working and went to pivot and her foot didnt pivot and she twisted it. She thinks her ankle is now sore from sitting so much.    Currently in Pain? Yes    Pain Score 1     Pain Location Ankle    Pain Orientation Left                             [facility] Adult PT Treatment/Exercise - 01/24/20 0001      Ankle Exercises: Stretches   Slant Board  Stretch 3 reps;30 seconds      Ankle Exercises: Standing   Vector Stance Right;Left;5 reps;5 seconds    Rocker Board 1 minute;2 minutes    Heel Raises Left;Right;10 reps;Both   2 sets on step   Toe Raise 20 reps    Heel Walk (Round Trip) 2RT    Toe Walk (Round Trip) 2RT    Other Standing Ankle Exercises tandem on foam 2x30 seconds bilateral    Other Standing Ankle Exercises lateral stepping  4x 15 feet                  PT Education - 01/24/20 1534    Education Details Patient educated on hep, exercise mechanics    Person(s) Educated Patient    Methods Explanation;Demonstration    Comprehension Verbalized understanding;Returned demonstration            PT Short Term Goals - 01/02/20 1618      PT SHORT TERM GOAL #1   Title Patient will be independent with HEP in order to improve functional outcomes.    Time 3  Period Weeks    Status New    Target Date 01/23/20      PT SHORT TERM GOAL #2   Title Patient will report at least 25% improvement in symptoms for improved quality of life.    Time 3    Period Weeks    Status New    Target Date 01/23/20             PT Long Term Goals - 01/02/20 1619      PT LONG TERM GOAL #1   Title Patient will report at least 75% improvement in symptoms for improved quality of life.    Time 6    Period Weeks    Status New    Target Date 02/13/20      PT LONG TERM GOAL #2   Title Patient will be able to ambulate at least 400 feet in 2MWT in order to demonstrate improved gait speed for community ambulation.    Time 6    Period Weeks    Status New    Target Date 02/13/20      PT LONG TERM GOAL #3   Title Patient will improve L ankle DF ROM to 0 degrees for improved gait mechanics.    Time 6    Period Weeks    Status New    Target Date 02/13/20                 Plan - 01/24/20 1535    Clinical Impression Statement Patient able to complete heel raises on edge of step with cueing for eccentric control with good  mechanics. Patient with min sway with tandem balance on foam with greater difficulty with LLE posterior. She requires unilateral finger support to complete SLS with vectors secondary to impaired balance. Discussed POC with patient and will most likely D/c from PT next week based on how she continues to feel since recent flare up in symptoms. She requires bilateral UE support with rocker board. Patient will continue to benefit from skilled physical therapy in order to reduce impairment and improve function.    Personal Factors and Comorbidities Fitness;Time since onset of injury/illness/exacerbation;Past/Current Experience;Age    Examination-Activity Limitations Locomotion Level;Stairs;Squat;Bend    Examination-Participation Restrictions Lawyer;Shop;Meal Prep;Community Activity;Cleaning    Stability/Clinical Decision Making Stable/Uncomplicated    Rehab Potential Good    PT Frequency 2x / week    PT Duration 6 weeks    PT Treatment/Interventions ADLs/Self Care Home Management;Aquatic Therapy;Cryotherapy;Electrical Stimulation;Iontophoresis 10m/ml Dexamethasone;Moist Heat;Traction;Ultrasound;DME Instruction;Gait training;Stair training;Functional mobility training;Therapeutic activities;Therapeutic exercise;Balance training;Neuromuscular re-education;Patient/family education;Manual techniques;Scar mobilization;Passive range of motion;Compression bandaging;Dry needling;Energy conservation;Splinting;Taping;Vasopneumatic Device;Spinal Manipulations;Joint Manipulations    PT Next Visit Plan progress standing and stability exercises. reassess next week for possible d/c    PT Home Exercise Plan ankle pumps; 12/15: towel crunch and inversion/eversion.  12/22:  single heelraises, RTB inversion/eversion           Patient will benefit from skilled therapeutic intervention in order to improve the following deficits and impairments:  Abnormal gait,Decreased range of motion,Difficulty walking,Decreased  activity tolerance,Pain,Decreased balance,Impaired flexibility,Improper body mechanics,Decreased mobility,Decreased strength,Increased edema  Visit Diagnosis: Muscle weakness (generalized)  Pain in left ankle and joints of left foot  Other abnormalities of gait and mobility  Other symptoms and signs involving the musculoskeletal system     Problem List Patient Active Problem List   Diagnosis Date Noted  . Encounter for screening colonoscopy 08/19/2017  . Gluten intolerance 08/19/2017    4:14 PM, 01/24/20 AMearl LatinPT,  DPT Physical Therapist at [place], [place], [address] Phone: [phone]   Fax:  [phone]  Name: Megan Maldonado MRN: [id number] Date of Birth: 1960/09/09

## 2020-01-29 ENCOUNTER — Other Ambulatory Visit: Payer: Self-pay

## 2020-01-29 ENCOUNTER — Encounter (HOSPITAL_COMMUNITY): Payer: Self-pay | Admitting: Physical Therapy

## 2020-01-29 ENCOUNTER — Ambulatory Visit (HOSPITAL_COMMUNITY): Payer: 59 | Admitting: Physical Therapy

## 2020-01-29 DIAGNOSIS — M6281 Muscle weakness (generalized): Secondary | ICD-10-CM

## 2020-01-29 DIAGNOSIS — R2689 Other abnormalities of gait and mobility: Secondary | ICD-10-CM

## 2020-01-29 DIAGNOSIS — M25572 Pain in left ankle and joints of left foot: Secondary | ICD-10-CM

## 2020-01-29 DIAGNOSIS — R29898 Other symptoms and signs involving the musculoskeletal system: Secondary | ICD-10-CM

## 2020-01-29 NOTE — Therapy (Signed)
[place], [place], [address] Phone: [phone]   Fax:  [phone]  Physical Therapy Treatment/Discharge Summary  Patient Details  Name: Megan Maldonado MRN: [id number] Date of Birth: 01/19/1961 Referring Provider (PT): Arther Abbott MD   Encounter Date: 01/29/2020  PHYSICAL THERAPY DISCHARGE SUMMARY  Visits from Start of Care: 6  Current functional level related to goals / functional outcomes: See below   Remaining deficits: See below   Education / Equipment: See below  Plan: Patient agrees to discharge.  Patient goals were met. Patient is being discharged due to meeting the stated rehab goals.  ?????        PT End of Session - 01/29/20 1438    Visit Number 6    Number of Visits 12    Date for PT Re-Evaluation 02/13/20    Authorization Type [employer] (auth yes, vl 30)    Authorization - Visit Number 6    Authorization - Number of Visits 30    Progress Note Due on Visit 10    PT Start Time 7062    PT Stop Time 1503    PT Time Calculation (min) 25 min    Activity Tolerance Patient tolerated treatment well    Behavior During Therapy WFL for tasks assessed/performed           Past Medical History:  Diagnosis Date  . Anxiety   . Arthritis   . Chronic back pain   . HTN (hypertension)     Past Surgical History:  Procedure Laterality Date  . BACK SURGERY  2008  . CARPAL TUNNEL RELEASE Bilateral 2002  . COLONOSCOPY WITH PROPOFOL N/A 10/04/2017   Procedure: COLONOSCOPY WITH PROPOFOL;  Surgeon: Daneil Dolin, MD;  [facility];  Service: Endoscopy;  Laterality: N/A;  10:30am  . POLYPECTOMY  10/04/2017   Procedure: POLYPECTOMY;  Surgeon: Daneil Dolin, MD;  [facility];  Service: Endoscopy;;  rectal  . TONSILLECTOMY      There were no vitals filed for this visit.   Subjective Assessment - 01/29/20 1440    Subjective Patient states she has no issues with her  foot/ankle at all. Her home exercises are going well. Patient states 90-95% improvement with physical therapy intervention. She remains limited by intermittent symptoms.    Limitations Walking;House hold activities    Currently in Pain? No/denies              [facility] PT Assessment - 01/29/20 0001      Assessment   Medical Diagnosis L ankle/foot pain    Referring Provider (PT) Arther Abbott MD    Onset Date/Surgical Date 12/20/18    Next MD Visit 1/13    Prior Therapy Back      Precautions   Precautions None      Restrictions   Weight Bearing Restrictions No      Balance Screen   Has the patient fallen in the past 6 months No    Has the patient had a decrease in activity level because of a fear of falling?  No    Is the patient reluctant to leave their home because of a fear of falling?  No      Prior Function   Level of Independence Independent    Vocation Unemployed      Cognition   Overall Cognitive Status Within Functional Limits for tasks assessed      Observation/Other Assessments   Observations  Patient ambulates without AD      AROM   Left Ankle Dorsiflexion 0    Left Ankle Plantar Flexion 55      Strength   Left Ankle Dorsiflexion 5/5    Left Ankle Inversion 5/5    Left Ankle Eversion 5/5      Ambulation/Gait   Ambulation/Gait Yes    Ambulation/Gait Assistance 7: Independent    Ambulation Distance (Feet) 375 Feet    Assistive device None    Gait Pattern Within Functional Limits    Ambulation Surface Level;Indoor    Gait Comments 2MWT                                 PT Education - 01/29/20 1439    Education Details Patient educated on hep, exercise mechanics, returning to phsycial therapy if needed    Person(s) Educated Patient    Methods Explanation;Demonstration;Handout    Comprehension Verbalized understanding;Returned demonstration            PT Short Term Goals - 01/29/20 1444      PT SHORT TERM GOAL #1   Title  Patient will be independent with HEP in order to improve functional outcomes.    Time 3    Period Weeks    Status Achieved    Target Date 01/23/20      PT SHORT TERM GOAL #2   Title Patient will report at least 25% improvement in symptoms for improved quality of life.    Time 3    Period Weeks    Status Achieved    Target Date 01/23/20             PT Long Term Goals - 01/29/20 1445      PT LONG TERM GOAL #1   Title Patient will report at least 75% improvement in symptoms for improved quality of life.    Time 6    Period Weeks    Status Achieved      PT LONG TERM GOAL #2   Title Patient will be able to ambulate at least 400 feet in 2MWT in order to demonstrate improved gait speed for community ambulation.    Time 6    Period Weeks    Status Achieved      PT LONG TERM GOAL #3   Title Patient will improve L ankle DF ROM to 0 degrees for improved gait mechanics.    Time 6    Period Weeks    Status Achieved                 Plan - 01/29/20 1439    Clinical Impression Statement Patient has met all short and long term goals at this time with ability to complete HEP and improvement in symptoms, gait, ambulation ability, ROM, and functional mobility. Patient continues to have minimal intermittent symptoms but overall states no further complaints. Patient showing improving function since starting therapy and is ready to transition to HEP. Patient discharged from physical therapy at this time.    Personal Factors and Comorbidities Fitness;Time since onset of injury/illness/exacerbation;Past/Current Experience;Age    Examination-Activity Limitations Locomotion Level;Stairs;Squat;Bend    Examination-Participation Restrictions Lawyer;Shop;Meal Prep;Community Activity;Cleaning    Stability/Clinical Decision Making Stable/Uncomplicated    Rehab Potential Good    PT Frequency 2x / week    PT Duration 6 weeks    PT Treatment/Interventions ADLs/Self Care Home  Management;Aquatic Therapy;Cryotherapy;Electrical Stimulation;Iontophoresis 52m/ml Dexamethasone;Moist Heat;Traction;Ultrasound;DME  Instruction;Gait training;Stair training;Functional mobility training;Therapeutic activities;Therapeutic exercise;Balance training;Neuromuscular re-education;Patient/family education;Manual techniques;Scar mobilization;Passive range of motion;Compression bandaging;Dry needling;Energy conservation;Splinting;Taping;Vasopneumatic Device;Spinal Manipulations;Joint Manipulations    PT Next Visit Plan n/a    PT Home Exercise Plan ankle pumps; 12/15: towel crunch and inversion/eversion.  12/22:  single heelraises, RTB inversion/eversion 1/10 SLS with vectors           Patient will benefit from skilled therapeutic intervention in order to improve the following deficits and impairments:  Abnormal gait,Decreased range of motion,Difficulty walking,Decreased activity tolerance,Pain,Decreased balance,Impaired flexibility,Improper body mechanics,Decreased mobility,Decreased strength,Increased edema  Visit Diagnosis: Muscle weakness (generalized)  Pain in left ankle and joints of left foot  Other abnormalities of gait and mobility  Other symptoms and signs involving the musculoskeletal system     Problem List Patient Active Problem List   Diagnosis Date Noted  . Encounter for screening colonoscopy 08/19/2017  . Gluten intolerance 08/19/2017    3:09 PM, 01/29/20 Mearl Latin PT, DPT Physical Therapist at [place], [place], [address] Phone: [phone]   Fax:  [phone]  Name: Megan Maldonado MRN: [id number] Date of Birth: [birth year]

## 2020-01-29 NOTE — Patient Instructions (Signed)
Access Code: [email or web address] URL: [email or web address] Date: 01/29/2020 Prepared by: Mitzi Hansen Glenora Morocho  Exercises Single Leg Balance with Clock Reach - 1 x daily - 7 x weekly - 5 reps - 5 second holds hold

## 2020-01-31 ENCOUNTER — Ambulatory Visit (HOSPITAL_COMMUNITY): Payer: 59 | Admitting: Physical Therapy

## 2020-02-01 ENCOUNTER — Ambulatory Visit: Payer: 59 | Admitting: Orthopedic Surgery

## 2020-02-01 ENCOUNTER — Encounter: Payer: Self-pay | Admitting: Orthopedic Surgery

## 2020-02-01 ENCOUNTER — Other Ambulatory Visit: Payer: Self-pay

## 2020-02-01 VITALS — Ht 62.0 in | Wt 213.0 lb

## 2020-02-01 DIAGNOSIS — M7672 Peroneal tendinitis, left leg: Secondary | ICD-10-CM

## 2020-02-01 NOTE — Progress Notes (Signed)
FOLLOW-UP OFFICE VISIT   Encounter Diagnosis  Name Primary?  . Peroneal tendinitis of lower leg, left Yes    60 year old female presented to [place] on November 29 with atraumatic onset of posterior lateral ankle pain diagnosed with peroneal tendinitis.  She was sent for physical therapy and she was placed in a brace  She was doing well until Christmas when she turned a corner and felt acute pain and had severe pain for about 5 days.  She self treated with ice, activity modification and improved to the point where now she is much better with only occasional discomfort in the posterior lateral area of the fibula  (and prior treatment)  + EXAM FINDINGS: Left ankle exam showed mild tenderness posterior to the fibula no popping or clicking normal strength and normal range of motion   ASSESSMENT AND PLAN Probable peroneal tendon injury may have completed a partial tear but currently doing well and she is okay with self management unless she gets worse and then we can do an MRI if needed

## 2020-02-01 NOTE — Patient Instructions (Signed)
[place]  Do the exercises as taught in physical therapy  As long as you continue to get better things should go okay if not please call us back for repeat exam

## 2020-02-05 ENCOUNTER — Encounter (HOSPITAL_COMMUNITY): Payer: 59 | Admitting: Physical Therapy

## 2020-02-07 ENCOUNTER — Encounter (HOSPITAL_COMMUNITY): Payer: 59 | Admitting: Physical Therapy

## 2020-07-02 ENCOUNTER — Other Ambulatory Visit (HOSPITAL_COMMUNITY): Payer: Self-pay | Admitting: Internal Medicine

## 2020-07-02 DIAGNOSIS — Z1231 Encounter for screening mammogram for malignant neoplasm of breast: Secondary | ICD-10-CM

## 2020-07-04 ENCOUNTER — Ambulatory Visit (HOSPITAL_COMMUNITY)
Admission: RE | Admit: 2020-07-04 | Discharge: 2020-07-04 | Disposition: A | Discharge status: Home / Self Care | Payer: Medicare Other | Source: Ambulatory Visit | Attending: Internal Medicine | Admitting: Internal Medicine

## 2020-07-04 DIAGNOSIS — Z1231 Encounter for screening mammogram for malignant neoplasm of breast: Secondary | ICD-10-CM | POA: Diagnosis not present

## 2020-07-25 DIAGNOSIS — Z0001 Encounter for general adult medical examination with abnormal findings: Secondary | ICD-10-CM | POA: Diagnosis not present

## 2020-07-25 DIAGNOSIS — L309 Dermatitis, unspecified: Secondary | ICD-10-CM | POA: Diagnosis not present

## 2020-07-25 DIAGNOSIS — Z6841 Body Mass Index (BMI) 40.0 and over, adult: Secondary | ICD-10-CM | POA: Diagnosis not present

## 2020-07-25 DIAGNOSIS — Z1389 Encounter for screening for other disorder: Secondary | ICD-10-CM | POA: Diagnosis not present

## 2020-07-25 DIAGNOSIS — F32A Depression, unspecified: Secondary | ICD-10-CM | POA: Diagnosis not present

## 2020-08-01 DIAGNOSIS — Z87828 Personal history of other (healed) physical injury and trauma: Secondary | ICD-10-CM | POA: Diagnosis not present

## 2020-08-01 DIAGNOSIS — K7689 Other specified diseases of liver: Secondary | ICD-10-CM | POA: Diagnosis not present

## 2020-08-01 DIAGNOSIS — K760 Fatty (change of) liver, not elsewhere classified: Secondary | ICD-10-CM | POA: Diagnosis not present

## 2020-08-07 ENCOUNTER — Encounter: Payer: Self-pay | Admitting: Internal Medicine

## 2020-09-19 DIAGNOSIS — U071 COVID-19: Secondary | ICD-10-CM | POA: Diagnosis not present

## 2020-09-19 DIAGNOSIS — I10 Essential (primary) hypertension: Secondary | ICD-10-CM | POA: Diagnosis not present

## 2020-09-19 DIAGNOSIS — M1991 Primary osteoarthritis, unspecified site: Secondary | ICD-10-CM | POA: Diagnosis not present

## 2020-10-01 ENCOUNTER — Encounter: Payer: Self-pay | Admitting: Internal Medicine

## 2020-10-01 ENCOUNTER — Ambulatory Visit: Payer: Medicare Other | Admitting: Internal Medicine

## 2020-10-28 DIAGNOSIS — Z6841 Body Mass Index (BMI) 40.0 and over, adult: Secondary | ICD-10-CM | POA: Diagnosis not present

## 2020-10-28 DIAGNOSIS — Z1389 Encounter for screening for other disorder: Secondary | ICD-10-CM | POA: Diagnosis not present

## 2020-10-28 DIAGNOSIS — M25552 Pain in left hip: Secondary | ICD-10-CM | POA: Diagnosis not present

## 2020-10-28 DIAGNOSIS — Z0001 Encounter for general adult medical examination with abnormal findings: Secondary | ICD-10-CM | POA: Diagnosis not present

## 2021-01-29 DIAGNOSIS — I10 Essential (primary) hypertension: Secondary | ICD-10-CM | POA: Diagnosis not present

## 2021-01-29 DIAGNOSIS — M1991 Primary osteoarthritis, unspecified site: Secondary | ICD-10-CM | POA: Diagnosis not present

## 2021-01-29 DIAGNOSIS — E782 Mixed hyperlipidemia: Secondary | ICD-10-CM | POA: Diagnosis not present

## 2021-01-29 DIAGNOSIS — Z23 Encounter for immunization: Secondary | ICD-10-CM | POA: Diagnosis not present

## 2021-04-01 DIAGNOSIS — L03119 Cellulitis of unspecified part of limb: Secondary | ICD-10-CM | POA: Diagnosis not present

## 2021-04-01 DIAGNOSIS — L234 Allergic contact dermatitis due to dyes: Secondary | ICD-10-CM | POA: Diagnosis not present

## 2021-04-01 DIAGNOSIS — Z6841 Body Mass Index (BMI) 40.0 and over, adult: Secondary | ICD-10-CM | POA: Diagnosis not present

## 2021-05-21 DIAGNOSIS — G8929 Other chronic pain: Secondary | ICD-10-CM | POA: Diagnosis not present

## 2021-05-21 DIAGNOSIS — Z6841 Body Mass Index (BMI) 40.0 and over, adult: Secondary | ICD-10-CM | POA: Diagnosis not present

## 2021-05-21 DIAGNOSIS — F419 Anxiety disorder, unspecified: Secondary | ICD-10-CM | POA: Diagnosis not present

## 2021-07-01 ENCOUNTER — Other Ambulatory Visit (HOSPITAL_COMMUNITY): Payer: Self-pay | Admitting: Internal Medicine

## 2021-07-01 DIAGNOSIS — Z1231 Encounter for screening mammogram for malignant neoplasm of breast: Secondary | ICD-10-CM

## 2021-07-09 ENCOUNTER — Ambulatory Visit (HOSPITAL_COMMUNITY)
Admission: RE | Admit: 2021-07-09 | Discharge: 2021-07-09 | Disposition: A | Discharge status: Home / Self Care | Payer: Medicare Other | Source: Ambulatory Visit | Attending: Internal Medicine | Admitting: Internal Medicine

## 2021-07-09 DIAGNOSIS — Z1231 Encounter for screening mammogram for malignant neoplasm of breast: Secondary | ICD-10-CM | POA: Insufficient documentation

## 2021-07-29 DIAGNOSIS — M1991 Primary osteoarthritis, unspecified site: Secondary | ICD-10-CM | POA: Diagnosis not present

## 2021-07-29 DIAGNOSIS — Z0000 Encounter for general adult medical examination without abnormal findings: Secondary | ICD-10-CM | POA: Diagnosis not present

## 2021-07-29 DIAGNOSIS — Z0001 Encounter for general adult medical examination with abnormal findings: Secondary | ICD-10-CM | POA: Diagnosis not present

## 2021-07-29 DIAGNOSIS — I10 Essential (primary) hypertension: Secondary | ICD-10-CM | POA: Diagnosis not present

## 2021-07-29 DIAGNOSIS — E559 Vitamin D deficiency, unspecified: Secondary | ICD-10-CM | POA: Diagnosis not present

## 2021-07-29 DIAGNOSIS — E039 Hypothyroidism, unspecified: Secondary | ICD-10-CM | POA: Diagnosis not present

## 2021-07-29 DIAGNOSIS — B352 Tinea manuum: Secondary | ICD-10-CM | POA: Diagnosis not present

## 2021-07-29 DIAGNOSIS — Z125 Encounter for screening for malignant neoplasm of prostate: Secondary | ICD-10-CM | POA: Diagnosis not present

## 2021-09-26 DIAGNOSIS — M5431 Sciatica, right side: Secondary | ICD-10-CM | POA: Diagnosis not present

## 2021-09-26 DIAGNOSIS — Z6841 Body Mass Index (BMI) 40.0 and over, adult: Secondary | ICD-10-CM | POA: Diagnosis not present

## 2021-09-26 DIAGNOSIS — R7309 Other abnormal glucose: Secondary | ICD-10-CM | POA: Diagnosis not present

## 2021-09-26 DIAGNOSIS — M62838 Other muscle spasm: Secondary | ICD-10-CM | POA: Diagnosis not present

## 2021-12-03 DIAGNOSIS — I10 Essential (primary) hypertension: Secondary | ICD-10-CM | POA: Diagnosis not present

## 2021-12-03 DIAGNOSIS — E782 Mixed hyperlipidemia: Secondary | ICD-10-CM | POA: Diagnosis not present

## 2021-12-03 DIAGNOSIS — Z6838 Body mass index (BMI) 38.0-38.9, adult: Secondary | ICD-10-CM | POA: Diagnosis not present

## 2021-12-03 DIAGNOSIS — E6609 Other obesity due to excess calories: Secondary | ICD-10-CM | POA: Diagnosis not present

## 2022-01-06 DIAGNOSIS — M1991 Primary osteoarthritis, unspecified site: Secondary | ICD-10-CM | POA: Diagnosis not present

## 2022-01-06 DIAGNOSIS — E1165 Type 2 diabetes mellitus with hyperglycemia: Secondary | ICD-10-CM | POA: Diagnosis not present

## 2022-01-06 DIAGNOSIS — I10 Essential (primary) hypertension: Secondary | ICD-10-CM | POA: Diagnosis not present

## 2022-01-06 DIAGNOSIS — E6609 Other obesity due to excess calories: Secondary | ICD-10-CM | POA: Diagnosis not present

## 2022-01-21 DIAGNOSIS — H5213 Myopia, bilateral: Secondary | ICD-10-CM | POA: Diagnosis not present

## 2022-02-06 DIAGNOSIS — E1165 Type 2 diabetes mellitus with hyperglycemia: Secondary | ICD-10-CM | POA: Diagnosis not present

## 2022-02-06 DIAGNOSIS — Z6841 Body Mass Index (BMI) 40.0 and over, adult: Secondary | ICD-10-CM | POA: Diagnosis not present

## 2022-02-06 DIAGNOSIS — M159 Polyosteoarthritis, unspecified: Secondary | ICD-10-CM | POA: Diagnosis not present

## 2022-02-06 DIAGNOSIS — H8110 Benign paroxysmal vertigo, unspecified ear: Secondary | ICD-10-CM | POA: Diagnosis not present

## 2022-02-06 DIAGNOSIS — I10 Essential (primary) hypertension: Secondary | ICD-10-CM | POA: Diagnosis not present

## 2022-02-06 DIAGNOSIS — M1991 Primary osteoarthritis, unspecified site: Secondary | ICD-10-CM | POA: Diagnosis not present

## 2022-06-11 ENCOUNTER — Other Ambulatory Visit (HOSPITAL_COMMUNITY): Payer: Self-pay | Admitting: Internal Medicine

## 2022-06-11 DIAGNOSIS — Z1231 Encounter for screening mammogram for malignant neoplasm of breast: Secondary | ICD-10-CM

## 2022-06-16 DIAGNOSIS — M1991 Primary osteoarthritis, unspecified site: Secondary | ICD-10-CM | POA: Diagnosis not present

## 2022-06-16 DIAGNOSIS — I10 Essential (primary) hypertension: Secondary | ICD-10-CM | POA: Diagnosis not present

## 2022-06-16 DIAGNOSIS — E1165 Type 2 diabetes mellitus with hyperglycemia: Secondary | ICD-10-CM | POA: Diagnosis not present

## 2022-07-13 ENCOUNTER — Ambulatory Visit (HOSPITAL_COMMUNITY)
Admission: RE | Admit: 2022-07-13 | Discharge: 2022-07-13 | Disposition: A | Discharge status: Home / Self Care | Payer: Medicare Other | Source: Ambulatory Visit | Attending: Internal Medicine | Admitting: Internal Medicine

## 2022-07-13 ENCOUNTER — Encounter (HOSPITAL_COMMUNITY): Payer: Self-pay

## 2022-07-13 DIAGNOSIS — Z1231 Encounter for screening mammogram for malignant neoplasm of breast: Secondary | ICD-10-CM | POA: Insufficient documentation

## 2022-07-31 DIAGNOSIS — I10 Essential (primary) hypertension: Secondary | ICD-10-CM | POA: Diagnosis not present

## 2022-07-31 DIAGNOSIS — M5431 Sciatica, right side: Secondary | ICD-10-CM | POA: Diagnosis not present

## 2022-07-31 DIAGNOSIS — E6609 Other obesity due to excess calories: Secondary | ICD-10-CM | POA: Diagnosis not present

## 2022-07-31 DIAGNOSIS — Z0001 Encounter for general adult medical examination with abnormal findings: Secondary | ICD-10-CM | POA: Diagnosis not present

## 2022-07-31 DIAGNOSIS — M159 Polyosteoarthritis, unspecified: Secondary | ICD-10-CM | POA: Diagnosis not present

## 2022-07-31 DIAGNOSIS — E559 Vitamin D deficiency, unspecified: Secondary | ICD-10-CM | POA: Diagnosis not present

## 2022-07-31 DIAGNOSIS — H8110 Benign paroxysmal vertigo, unspecified ear: Secondary | ICD-10-CM | POA: Diagnosis not present

## 2022-07-31 DIAGNOSIS — E782 Mixed hyperlipidemia: Secondary | ICD-10-CM | POA: Diagnosis not present

## 2022-07-31 DIAGNOSIS — M62838 Other muscle spasm: Secondary | ICD-10-CM | POA: Diagnosis not present

## 2022-07-31 DIAGNOSIS — E1165 Type 2 diabetes mellitus with hyperglycemia: Secondary | ICD-10-CM | POA: Diagnosis not present

## 2022-07-31 DIAGNOSIS — D518 Other vitamin B12 deficiency anemias: Secondary | ICD-10-CM | POA: Diagnosis not present

## 2022-07-31 DIAGNOSIS — M1991 Primary osteoarthritis, unspecified site: Secondary | ICD-10-CM | POA: Diagnosis not present

## 2022-07-31 DIAGNOSIS — E039 Hypothyroidism, unspecified: Secondary | ICD-10-CM | POA: Diagnosis not present

## 2022-07-31 DIAGNOSIS — Z1331 Encounter for screening for depression: Secondary | ICD-10-CM | POA: Diagnosis not present

## 2022-07-31 DIAGNOSIS — Z6839 Body mass index (BMI) 39.0-39.9, adult: Secondary | ICD-10-CM | POA: Diagnosis not present

## 2022-11-11 DIAGNOSIS — E1165 Type 2 diabetes mellitus with hyperglycemia: Secondary | ICD-10-CM | POA: Diagnosis not present

## 2022-11-11 DIAGNOSIS — Z6841 Body Mass Index (BMI) 40.0 and over, adult: Secondary | ICD-10-CM | POA: Diagnosis not present

## 2022-11-11 DIAGNOSIS — Z23 Encounter for immunization: Secondary | ICD-10-CM | POA: Diagnosis not present

## 2022-11-11 DIAGNOSIS — M5431 Sciatica, right side: Secondary | ICD-10-CM | POA: Diagnosis not present

## 2022-11-11 DIAGNOSIS — I10 Essential (primary) hypertension: Secondary | ICD-10-CM | POA: Diagnosis not present

## 2022-11-11 DIAGNOSIS — E782 Mixed hyperlipidemia: Secondary | ICD-10-CM | POA: Diagnosis not present

## 2022-11-11 DIAGNOSIS — M159 Polyosteoarthritis, unspecified: Secondary | ICD-10-CM | POA: Diagnosis not present

## 2022-11-11 DIAGNOSIS — H6121 Impacted cerumen, right ear: Secondary | ICD-10-CM | POA: Diagnosis not present

## 2022-11-11 DIAGNOSIS — M1991 Primary osteoarthritis, unspecified site: Secondary | ICD-10-CM | POA: Diagnosis not present

## 2022-11-16 DIAGNOSIS — Z9229 Personal history of other drug therapy: Secondary | ICD-10-CM | POA: Diagnosis not present

## 2022-11-16 DIAGNOSIS — Z0001 Encounter for general adult medical examination with abnormal findings: Secondary | ICD-10-CM | POA: Diagnosis not present

## 2022-11-16 DIAGNOSIS — I10 Essential (primary) hypertension: Secondary | ICD-10-CM | POA: Diagnosis not present

## 2023-03-22 DIAGNOSIS — E1165 Type 2 diabetes mellitus with hyperglycemia: Secondary | ICD-10-CM | POA: Diagnosis not present

## 2023-03-22 DIAGNOSIS — Z6839 Body mass index (BMI) 39.0-39.9, adult: Secondary | ICD-10-CM | POA: Diagnosis not present

## 2023-03-22 DIAGNOSIS — M5431 Sciatica, right side: Secondary | ICD-10-CM | POA: Diagnosis not present

## 2023-03-22 DIAGNOSIS — E6609 Other obesity due to excess calories: Secondary | ICD-10-CM | POA: Diagnosis not present

## 2023-03-22 DIAGNOSIS — M159 Polyosteoarthritis, unspecified: Secondary | ICD-10-CM | POA: Diagnosis not present

## 2023-03-22 DIAGNOSIS — I10 Essential (primary) hypertension: Secondary | ICD-10-CM | POA: Diagnosis not present

## 2023-06-17 DIAGNOSIS — M159 Polyosteoarthritis, unspecified: Secondary | ICD-10-CM | POA: Diagnosis not present

## 2023-06-17 DIAGNOSIS — M5412 Radiculopathy, cervical region: Secondary | ICD-10-CM | POA: Diagnosis not present

## 2023-06-17 DIAGNOSIS — E1165 Type 2 diabetes mellitus with hyperglycemia: Secondary | ICD-10-CM | POA: Diagnosis not present

## 2023-06-17 DIAGNOSIS — Z6839 Body mass index (BMI) 39.0-39.9, adult: Secondary | ICD-10-CM | POA: Diagnosis not present

## 2023-06-17 DIAGNOSIS — I10 Essential (primary) hypertension: Secondary | ICD-10-CM | POA: Diagnosis not present

## 2023-06-29 ENCOUNTER — Other Ambulatory Visit (HOSPITAL_COMMUNITY): Payer: Self-pay | Admitting: Internal Medicine

## 2023-06-29 DIAGNOSIS — M5412 Radiculopathy, cervical region: Secondary | ICD-10-CM

## 2023-07-05 ENCOUNTER — Ambulatory Visit (HOSPITAL_COMMUNITY)
Admission: RE | Admit: 2023-07-05 | Discharge: 2023-07-05 | Disposition: A | Discharge status: Home / Self Care | Source: Ambulatory Visit | Attending: Internal Medicine | Admitting: Internal Medicine

## 2023-07-05 DIAGNOSIS — M50223 Other cervical disc displacement at C6-C7 level: Secondary | ICD-10-CM | POA: Diagnosis not present

## 2023-07-05 DIAGNOSIS — M5412 Radiculopathy, cervical region: Secondary | ICD-10-CM

## 2023-07-05 DIAGNOSIS — M50222 Other cervical disc displacement at C5-C6 level: Secondary | ICD-10-CM | POA: Diagnosis not present

## 2023-07-05 DIAGNOSIS — M4312 Spondylolisthesis, cervical region: Secondary | ICD-10-CM | POA: Diagnosis not present

## 2023-07-05 DIAGNOSIS — M4722 Other spondylosis with radiculopathy, cervical region: Secondary | ICD-10-CM | POA: Diagnosis not present

## 2023-07-05 DIAGNOSIS — M47812 Spondylosis without myelopathy or radiculopathy, cervical region: Secondary | ICD-10-CM | POA: Diagnosis not present

## 2023-07-05 DIAGNOSIS — M4802 Spinal stenosis, cervical region: Secondary | ICD-10-CM | POA: Diagnosis not present

## 2023-07-29 ENCOUNTER — Other Ambulatory Visit (HOSPITAL_COMMUNITY): Payer: Self-pay | Admitting: Internal Medicine

## 2023-07-29 DIAGNOSIS — Z1231 Encounter for screening mammogram for malignant neoplasm of breast: Secondary | ICD-10-CM

## 2023-08-05 DIAGNOSIS — M5416 Radiculopathy, lumbar region: Secondary | ICD-10-CM | POA: Diagnosis not present

## 2023-08-05 DIAGNOSIS — Z6841 Body Mass Index (BMI) 40.0 and over, adult: Secondary | ICD-10-CM | POA: Diagnosis not present

## 2023-08-05 DIAGNOSIS — M5412 Radiculopathy, cervical region: Secondary | ICD-10-CM | POA: Diagnosis not present

## 2023-08-06 ENCOUNTER — Other Ambulatory Visit: Payer: Self-pay | Admitting: Neurosurgery

## 2023-08-09 ENCOUNTER — Ambulatory Visit (HOSPITAL_COMMUNITY)
Admission: RE | Admit: 2023-08-09 | Discharge: 2023-08-09 | Disposition: A | Discharge status: Home / Self Care | Source: Ambulatory Visit | Attending: Internal Medicine | Admitting: Internal Medicine

## 2023-08-09 ENCOUNTER — Telehealth: Payer: Self-pay

## 2023-08-09 DIAGNOSIS — Z1231 Encounter for screening mammogram for malignant neoplasm of breast: Secondary | ICD-10-CM | POA: Insufficient documentation

## 2023-08-09 NOTE — Telephone Encounter (Signed)
 Copied from CRM [id number]. Topic: Appointments - Scheduling Inquiry for Clinic >> Aug 09, 2023 12:21 PM Megan Maldonado wrote: Reason for CRM: Pt called and wants to schedule with [facility], not appts available. Template not showing slots  Best contact:[phone]   ----------------------------------------------------------------------- From previous Reason for Contact - Scheduling: Patient/patient representative is calling to schedule an appointment. Refer to attachments for appointment information.

## 2023-08-09 NOTE — Progress Notes (Addendum)
 Surgical Instructions   Your procedure is scheduled on Monday August 16, 2023. Report to [facility]tr[facility] at 1200 Noon, then check in with the Admitting office. Any questions or running late day of surgery: call [phone]  Questions prior to your surgery date: call [phone], Monday-Friday, 8am-4pm. If you experience any cold or flu symptoms such as cough, fever, chills, shortness of breath, etc. between now and your scheduled surgery, please notify us  at the above number.     Remember:  Do not eat after midnight the night before your surgery  You may drink clear liquids until 11:00 the morning of your surgery.   Clear liquids allowed are: Water, Non-Citrus Juices (without pulp), Carbonated Beverages, Clear Tea (no milk, honey, etc.), Black Coffee Only (NO MILK, CREAM OR POWDERED CREAMER of any kind), and Gatorade.  Patient Instructions  The night before surgery:  No food after midnight. ONLY clear liquids after midnight  The day of surgery (if you do NOT have diabetes):  Drink ONE (1) Pre-Surgery Clear Ensure by 11:00 the morning of surgery. Drink in one sitting. Do not sip.  This drink was given to you during your hospital  pre-op appointment visit.  Nothing else to drink after completing the  Pre-Surgery Clear Ensure.         If you have questions, please contact your surgeon's office.    Take these medicines the morning of surgery with A SIP OF WATER  diazepam (VALIUM)  omeprazole (PRILOSEC)  rosuvastatin (CRESTOR)   May take these medicines IF NEEDED: acetaminophen (TYLENOL)  traMADol (ULTRAM)    Follow your surgeon's instructions on when to stop Asprin.  If no instructions were given by your surgeon then you will need to call the office to get those instructions.    One week prior to surgery, STOP taking any Aleve, Naproxen, Ibuprofen, Motrin, Advil, Goody's, BC's, all herbal medications, fish oil, and non-prescription vitamins. This includes your  meloxicam  (MOBIC ).                       Do NOT Smoke (Tobacco/Vaping) for 24 hours prior to your procedure.  If you use a CPAP at night, you may bring your mask/headgear for your overnight stay.   You will be asked to remove any contacts, glasses, piercing's, hearing aid's, dentures/partials prior to surgery. Please bring cases for these items if needed.    Patients discharged the day of surgery will not be allowed to drive home, and someone needs to stay with them for 24 hours.  SURGICAL WAITING ROOM VISITATION Patients may have no more than 2 support people in the waiting area - these visitors may rotate.   Pre-op nurse will coordinate an appropriate time for 1 ADULT support person, who may not rotate, to accompany patient in pre-op.  Children under the age of 37 must have an adult with them who is not the patient and must remain in the main waiting area with an adult.  If the patient needs to stay at the hospital during part of their recovery, the visitor guidelines for inpatient rooms apply.  Please refer to the [facility] website for the visitor guidelines for any additional information.   If you received a COVID test during your pre-op visit  it is requested that you wear a mask when out in public, stay away from anyone that may not be feeling well and notify your surgeon if you develop symptoms. If you have been in  contact with anyone that has tested positive in the last 10 days please notify you surgeon.      Pre-operative 5 CHG Bathing Instructions   You can play a key role in reducing the risk of infection after surgery. Your skin needs to be as free of germs as possible. You can reduce the number of germs on your skin by washing with CHG (chlorhexidine  gluconate) soap before surgery. CHG is an antiseptic soap that kills germs and continues to kill germs even after washing.   DO NOT use if you have an allergy to chlorhexidine /CHG or antibacterial soaps. If your skin becomes  reddened or irritated, stop using the CHG and notify one of our RNs at [phone].   Please shower with the CHG soap starting 4 days before surgery using the following schedule:     Please keep in mind the following:  DO NOT shave, including legs and underarms, starting the day of your first shower.   Place clean sheets on your bed the day you start using CHG soap. Use a clean washcloth (not used since being washed) for each shower. DO NOT sleep with pets once you start using the CHG.   CHG Shower Instructions:  Wash your face and private area with normal soap. If you choose to wash your hair, wash first with your normal shampoo.  After you use shampoo/soap, rinse your hair and body thoroughly to remove shampoo/soap residue.  Turn the water OFF and apply about 3 tablespoons (45 ml) of CHG soap to a CLEAN washcloth.  Apply CHG soap ONLY FROM YOUR NECK DOWN TO YOUR TOES (washing for 3-5 minutes)  DO NOT use CHG soap on face, private areas, open wounds, or sores.  Pay special attention to the area where your surgery is being performed.  If you are having back surgery, having someone wash your back for you may be helpful. Wait 2 minutes after CHG soap is applied, then you may rinse off the CHG soap.  Pat dry with a clean towel  Put on clean clothes/pajamas   If you choose to wear lotion, please use ONLY the CHG-compatible lotions that are listed below.  Additional instructions for the day of surgery: DO NOT APPLY any lotions, deodorants or perfumes.   Do not bring valuables to the hospital. [facility] is not responsible for any belongings/valuables. Do not wear nail polish, gel polish, artificial nails, or any other type of covering on natural nails (fingers and toes) Do not wear jewelry or makeup Put on clean/comfortable clothes.  Please brush your teeth.  Ask your nurse before applying any prescription medications to the skin.     CHG Compatible Lotions   Aveeno Moisturizing  lotion  Cetaphil Moisturizing Cream  Cetaphil Moisturizing Lotion  Clairol Herbal Essence Moisturizing Lotion, Dry Skin  Clairol Herbal Essence Moisturizing Lotion, Extra Dry Skin  Clairol Herbal Essence Moisturizing Lotion, Normal Skin  Curel Age Defying Therapeutic Moisturizing Lotion with Alpha Hydroxy  Curel Extreme Care Body Lotion  Curel Soothing Hands Moisturizing Hand Lotion  Curel Therapeutic Moisturizing Cream, Fragrance-Free  Curel Therapeutic Moisturizing Lotion, Fragrance-Free  Curel Therapeutic Moisturizing Lotion, Original Formula  Eucerin Daily Replenishing Lotion  Eucerin Dry Skin Therapy Plus Alpha Hydroxy Crme  Eucerin Dry Skin Therapy Plus Alpha Hydroxy Lotion  Eucerin Original Crme  Eucerin Original Lotion  Eucerin Plus Crme Eucerin Plus Lotion  Eucerin TriLipid Replenishing Lotion  Keri Anti-Bacterial Hand Lotion  Keri Deep Conditioning Original Lotion Dry Skin Formula Softly Scented  Keri Deep Conditioning Original Lotion, Fragrance Free Sensitive Skin Formula  Keri Lotion Fast Absorbing Fragrance Free Sensitive Skin Formula  Keri Lotion Fast Absorbing Softly Scented Dry Skin Formula  Keri Original Lotion  Keri Skin Renewal Lotion Keri Silky Smooth Lotion  Keri Silky Smooth Sensitive Skin Lotion  Nivea Body Creamy Conditioning Oil  Nivea Body Extra Enriched Lotion  Nivea Body Original Lotion  Nivea Body Sheer Moisturizing Lotion Nivea Crme  Nivea Skin Firming Lotion  NutraDerm 30 Skin Lotion  NutraDerm Skin Lotion  NutraDerm Therapeutic Skin Cream  NutraDerm Therapeutic Skin Lotion  ProShield Protective Hand Cream  Provon moisturizing lotion  Please read over the following fact sheets that you were given.

## 2023-08-09 NOTE — Telephone Encounter (Signed)
 scheduled

## 2023-08-10 ENCOUNTER — Encounter (HOSPITAL_COMMUNITY): Payer: Self-pay

## 2023-08-10 ENCOUNTER — Encounter (HOSPITAL_COMMUNITY)
Admission: RE | Admit: 2023-08-10 | Discharge: 2023-08-10 | Disposition: A | Discharge status: Home / Self Care | Source: Ambulatory Visit | Attending: Neurosurgery

## 2023-08-10 ENCOUNTER — Other Ambulatory Visit: Payer: Self-pay

## 2023-08-10 VITALS — BP 171/96 | HR 90 | Temp 98.2°F | Resp 18 | Ht 61.0 in | Wt 213.0 lb

## 2023-08-10 DIAGNOSIS — Z01818 Encounter for other preprocedural examination: Secondary | ICD-10-CM | POA: Insufficient documentation

## 2023-08-10 DIAGNOSIS — Z01810 Encounter for preprocedural cardiovascular examination: Secondary | ICD-10-CM | POA: Diagnosis not present

## 2023-08-10 DIAGNOSIS — Z01812 Encounter for preprocedural laboratory examination: Secondary | ICD-10-CM | POA: Diagnosis not present

## 2023-08-10 LAB — BASIC METABOLIC PANEL WITH GFR
Anion gap: 9 (ref 5–15)
BUN: 8 mg/dL (ref 8–23)
CO2: 28 mmol/L (ref 22–32)
Calcium: 9.8 mg/dL (ref 8.9–10.3)
Chloride: 103 mmol/L (ref 98–111)
Creatinine, Ser: 0.74 mg/dL (ref 0.44–1.00)
GFR, Estimated: >60 mL/min (ref >60)
Glucose, Bld: 92 mg/dL (ref 70–99)
Potassium: 3.9 mmol/L (ref 3.5–5.1)
Sodium: 140 mmol/L (ref 135–145)

## 2023-08-10 LAB — CBC
HCT: 43.1 % (ref 36.0–46.0)
Hemoglobin: 14.7 g/dL (ref 12.0–15.0)
MCH: 33.3 pg (ref 26.0–34.0)
MCHC: 34.1 g/dL (ref 30.0–36.0)
MCV: 97.5 fL (ref 80.0–100.0)
Platelets: 240 K/uL (ref 150–400)
RBC: 4.42 MIL/uL (ref 3.87–5.11)
RDW: 12.5 % (ref 11.5–15.5)
WBC: 9.7 K/uL (ref 4.0–10.5)
nRBC: 0 % (ref 0.0–0.2)

## 2023-08-10 LAB — SURGICAL PCR SCREEN
MRSA, PCR: NEGATIVE
Staphylococcus aureus: NEGATIVE

## 2023-08-10 NOTE — Progress Notes (Signed)
 PCP - Dr. Jerilynn Carnes Cardiologist -   PPM/ICD - denies Device Orders - na Rep Notified - na  Chest x-ray - na EKG - PAT, 08/10/2023 Stress Test -  ECHO -  Cardiac Cath -   Sleep Study - denies CPAP - na  Non-diabetic  Blood Thinner Instructions: denies Aspirin Instructions: states last dose 08/05/2023  ERAS Protcol - Ensure until 1100  Anesthesia review: No  Patient denies shortness of breath, fever, cough and chest pain at PAT appointment   All instructions explained to the patient, with a verbal understanding of the material. Patient agrees to go over the instructions while at home for a better understanding. Patient also instructed to self quarantine after being tested for COVID-19. The opportunity to ask questions was provided.

## 2023-08-16 ENCOUNTER — Ambulatory Visit (HOSPITAL_COMMUNITY)
Admission: RE | Admit: 2023-08-16 | Discharge: 2023-08-17 | Disposition: A | Discharge status: Home / Self Care | Attending: Neurosurgery | Admitting: Neurosurgery

## 2023-08-16 ENCOUNTER — Ambulatory Visit (HOSPITAL_COMMUNITY): Payer: Self-pay

## 2023-08-16 ENCOUNTER — Ambulatory Visit (HOSPITAL_COMMUNITY)
Admission: RE | Disposition: A | Discharge status: Home / Self Care | Payer: Self-pay | Source: Home / Self Care | Attending: Neurosurgery

## 2023-08-16 ENCOUNTER — Ambulatory Visit (HOSPITAL_COMMUNITY)

## 2023-08-16 ENCOUNTER — Ambulatory Visit (HOSPITAL_BASED_OUTPATIENT_CLINIC_OR_DEPARTMENT_OTHER): Payer: Self-pay

## 2023-08-16 ENCOUNTER — Encounter (HOSPITAL_COMMUNITY): Payer: Self-pay | Admitting: Neurosurgery

## 2023-08-16 ENCOUNTER — Other Ambulatory Visit: Payer: Self-pay

## 2023-08-16 DIAGNOSIS — Z7982 Long term (current) use of aspirin: Secondary | ICD-10-CM | POA: Insufficient documentation

## 2023-08-16 DIAGNOSIS — M4712 Other spondylosis with myelopathy, cervical region: Secondary | ICD-10-CM | POA: Insufficient documentation

## 2023-08-16 DIAGNOSIS — M47892 Other spondylosis, cervical region: Secondary | ICD-10-CM | POA: Diagnosis not present

## 2023-08-16 DIAGNOSIS — Z79899 Other long term (current) drug therapy: Secondary | ICD-10-CM | POA: Diagnosis not present

## 2023-08-16 DIAGNOSIS — Z6841 Body Mass Index (BMI) 40.0 and over, adult: Secondary | ICD-10-CM

## 2023-08-16 DIAGNOSIS — M778 Other enthesopathies, not elsewhere classified: Secondary | ICD-10-CM | POA: Insufficient documentation

## 2023-08-16 DIAGNOSIS — E222 Syndrome of inappropriate secretion of antidiuretic hormone: Secondary | ICD-10-CM

## 2023-08-16 DIAGNOSIS — R200 Anesthesia of skin: Secondary | ICD-10-CM | POA: Insufficient documentation

## 2023-08-16 DIAGNOSIS — I10 Essential (primary) hypertension: Secondary | ICD-10-CM | POA: Insufficient documentation

## 2023-08-16 DIAGNOSIS — Z87891 Personal history of nicotine dependence: Secondary | ICD-10-CM | POA: Insufficient documentation

## 2023-08-16 DIAGNOSIS — K219 Gastro-esophageal reflux disease without esophagitis: Secondary | ICD-10-CM | POA: Insufficient documentation

## 2023-08-16 DIAGNOSIS — Z6839 Body mass index (BMI) 39.0-39.9, adult: Secondary | ICD-10-CM | POA: Insufficient documentation

## 2023-08-16 DIAGNOSIS — G8929 Other chronic pain: Secondary | ICD-10-CM | POA: Diagnosis not present

## 2023-08-16 DIAGNOSIS — F419 Anxiety disorder, unspecified: Secondary | ICD-10-CM | POA: Diagnosis not present

## 2023-08-16 DIAGNOSIS — E66813 Obesity, class 3: Secondary | ICD-10-CM

## 2023-08-16 DIAGNOSIS — R531 Weakness: Secondary | ICD-10-CM | POA: Diagnosis not present

## 2023-08-16 DIAGNOSIS — Z791 Long term (current) use of non-steroidal anti-inflammatories (NSAID): Secondary | ICD-10-CM | POA: Insufficient documentation

## 2023-08-16 DIAGNOSIS — M1990 Unspecified osteoarthritis, unspecified site: Secondary | ICD-10-CM | POA: Insufficient documentation

## 2023-08-16 DIAGNOSIS — M50222 Other cervical disc displacement at C5-C6 level: Secondary | ICD-10-CM

## 2023-08-16 DIAGNOSIS — M4802 Spinal stenosis, cervical region: Secondary | ICD-10-CM | POA: Diagnosis present

## 2023-08-16 DIAGNOSIS — Z981 Arthrodesis status: Secondary | ICD-10-CM | POA: Diagnosis not present

## 2023-08-16 DIAGNOSIS — M542 Cervicalgia: Secondary | ICD-10-CM | POA: Diagnosis not present

## 2023-08-16 DIAGNOSIS — M549 Dorsalgia, unspecified: Secondary | ICD-10-CM | POA: Diagnosis not present

## 2023-08-16 DIAGNOSIS — M50122 Cervical disc disorder at C5-C6 level with radiculopathy: Secondary | ICD-10-CM | POA: Insufficient documentation

## 2023-08-16 DIAGNOSIS — M47812 Spondylosis without myelopathy or radiculopathy, cervical region: Secondary | ICD-10-CM | POA: Diagnosis not present

## 2023-08-16 DIAGNOSIS — M5412 Radiculopathy, cervical region: Secondary | ICD-10-CM | POA: Diagnosis not present

## 2023-08-16 HISTORY — PX: ANTERIOR CERVICAL DECOMP/DISCECTOMY FUSION: SHX1161

## 2023-08-16 SURGERY — ANTERIOR CERVICAL DECOMPRESSION/DISCECTOMY FUSION 2 LEVELS
Anesthesia: General

## 2023-08-16 MED ORDER — ORAL CARE MOUTH RINSE: 15.0000 mL | Freq: Once | OROMUCOSAL | Status: AC

## 2023-08-16 MED ORDER — LACTATED RINGERS IV SOLN: INTRAVENOUS | Status: DC

## 2023-08-16 MED ORDER — OMEGA-3-ACID ETHYL ESTERS 1 G PO CAPS
1.0000 | ORAL_CAPSULE | Freq: Every morning | ORAL | Status: DC
  Administered 2023-08-17: 1 g via ORAL
  Filled 2023-08-16: qty 1

## 2023-08-16 MED ORDER — OXYCODONE HCL 5 MG/5ML PO SOLN
ORAL | Status: AC
  Filled 2023-08-16: qty 5

## 2023-08-16 MED ORDER — DEXAMETHASONE SODIUM PHOSPHATE 10 MG/ML IJ SOLN
INTRAMUSCULAR | Status: DC | PRN
  Administered 2023-08-16: 10 mg via INTRAVENOUS

## 2023-08-16 MED ORDER — FENTANYL CITRATE (PF) 250 MCG/5ML IJ SOLN
INTRAMUSCULAR | Status: DC | PRN
  Administered 2023-08-16: 50 ug via INTRAVENOUS
  Administered 2023-08-16: 150 ug via INTRAVENOUS
  Administered 2023-08-16: 50 ug via INTRAVENOUS

## 2023-08-16 MED ORDER — LACTATED RINGERS IV SOLN: INTRAVENOUS | Status: DC | PRN

## 2023-08-16 MED ORDER — ACETAMINOPHEN 325 MG PO TABS: 650.0000 mg | ORAL_TABLET | ORAL | Status: DC | PRN

## 2023-08-16 MED ORDER — THROMBIN 5000 UNITS EX KIT
PACK | CUTANEOUS | Status: AC
  Filled 2023-08-16: qty 3

## 2023-08-16 MED ORDER — ACETAMINOPHEN ER 650 MG PO TBCR: 650.0000 mg | EXTENDED_RELEASE_TABLET | Freq: Three times a day (TID) | ORAL | Status: DC | PRN

## 2023-08-16 MED ORDER — DEXMEDETOMIDINE HCL IN NACL 80 MCG/20ML IV SOLN
INTRAVENOUS | Status: DC | PRN
Start: 2023-08-16 — End: 2023-08-16
  Administered 2023-08-16 (×2): 8 ug via INTRAVENOUS

## 2023-08-16 MED ORDER — SODIUM CHLORIDE 0.9 % IV SOLN: 250.0000 mL | INTRAVENOUS | Status: DC

## 2023-08-16 MED ORDER — DIAZEPAM 5 MG PO TABS
5.0000 mg | ORAL_TABLET | Freq: Every day | ORAL | Status: DC | PRN
  Administered 2023-08-17: 5 mg via ORAL
  Filled 2023-08-16: qty 1

## 2023-08-16 MED ORDER — CEFAZOLIN SODIUM-DEXTROSE 2-4 GM/100ML-% IV SOLN
2.0000 g | INTRAVENOUS | Status: AC
  Administered 2023-08-16: 2 g via INTRAVENOUS

## 2023-08-16 MED ORDER — COQ10 200 MG PO CAPS: 200.0000 mg | ORAL_CAPSULE | Freq: Every evening | ORAL | Status: DC

## 2023-08-16 MED ORDER — CHLORHEXIDINE GLUCONATE CLOTH 2 % EX PADS: 6.0000 | MEDICATED_PAD | Freq: Once | CUTANEOUS | Status: DC

## 2023-08-16 MED ORDER — PROPOFOL 10 MG/ML IV BOLUS
INTRAVENOUS | Status: DC | PRN
  Administered 2023-08-16: 50 mg via INTRAVENOUS
  Administered 2023-08-16: 30 mg via INTRAVENOUS
  Administered 2023-08-16: 120 mg via INTRAVENOUS

## 2023-08-16 MED ORDER — HYDROMORPHONE HCL 1 MG/ML IJ SOLN
INTRAMUSCULAR | Status: AC
  Filled 2023-08-16: qty 1

## 2023-08-16 MED ORDER — OXYCODONE HCL 5 MG PO TABS: 5.0000 mg | ORAL_TABLET | Freq: Once | ORAL | Status: AC | PRN

## 2023-08-16 MED ORDER — PHENYLEPHRINE HCL-NACL 20-0.9 MG/250ML-% IV SOLN
INTRAVENOUS | Status: DC | PRN
  Administered 2023-08-16: 40 ug/min via INTRAVENOUS

## 2023-08-16 MED ORDER — VITAMIN B-12 1000 MCG PO TABS
1000.0000 ug | ORAL_TABLET | Freq: Every morning | ORAL | Status: DC
  Administered 2023-08-17: 1000 ug via ORAL
  Filled 2023-08-16: qty 1

## 2023-08-16 MED ORDER — ALUM & MAG HYDROXIDE-SIMETH 200-200-20 MG/5ML PO SUSP: 30.0000 mL | Freq: Four times a day (QID) | ORAL | Status: DC | PRN

## 2023-08-16 MED ORDER — PHENYLEPHRINE 80 MCG/ML (10ML) SYRINGE FOR IV PUSH (FOR BLOOD PRESSURE SUPPORT)
PREFILLED_SYRINGE | INTRAVENOUS | Status: DC | PRN
  Administered 2023-08-16: 80 ug via INTRAVENOUS
  Administered 2023-08-16: 160 ug via INTRAVENOUS

## 2023-08-16 MED ORDER — PHENOL 1.4 % MT LIQD: 1.0000 | OROMUCOSAL | Status: DC | PRN

## 2023-08-16 MED ORDER — ROSUVASTATIN CALCIUM 5 MG PO TABS
5.0000 mg | ORAL_TABLET | Freq: Every morning | ORAL | Status: DC
  Administered 2023-08-17: 5 mg via ORAL
  Filled 2023-08-16: qty 1

## 2023-08-16 MED ORDER — LOSARTAN POTASSIUM 50 MG PO TABS
100.0000 mg | ORAL_TABLET | Freq: Every evening | ORAL | Status: DC
  Administered 2023-08-16: 100 mg via ORAL
  Filled 2023-08-16: qty 2

## 2023-08-16 MED ORDER — ROCURONIUM BROMIDE 10 MG/ML (PF) SYRINGE
PREFILLED_SYRINGE | INTRAVENOUS | Status: DC | PRN
  Administered 2023-08-16: 60 mg via INTRAVENOUS
  Administered 2023-08-16 (×2): 10 mg via INTRAVENOUS

## 2023-08-16 MED ORDER — PROPOFOL 10 MG/ML IV BOLUS
INTRAVENOUS | Status: AC
  Filled 2023-08-16: qty 20

## 2023-08-16 MED ORDER — CYCLOBENZAPRINE HCL 10 MG PO TABS
10.0000 mg | ORAL_TABLET | Freq: Three times a day (TID) | ORAL | Status: DC | PRN
  Administered 2023-08-16: 10 mg via ORAL
  Filled 2023-08-16: qty 1

## 2023-08-16 MED ORDER — VITAMIN C 500 MG PO TABS: 1000.0000 mg | ORAL_TABLET | Freq: Every day | ORAL | Status: DC

## 2023-08-16 MED ORDER — LIDOCAINE 2% (20 MG/ML) 5 ML SYRINGE
INTRAMUSCULAR | Status: AC
  Filled 2023-08-16: qty 5

## 2023-08-16 MED ORDER — 0.9 % SODIUM CHLORIDE (POUR BTL) OPTIME
TOPICAL | Status: DC | PRN
  Administered 2023-08-16: 1000 mL

## 2023-08-16 MED ORDER — MIDAZOLAM HCL 2 MG/2ML IJ SOLN: 0.5000 mg | Freq: Once | INTRAMUSCULAR | Status: DC | PRN

## 2023-08-16 MED ORDER — CEFAZOLIN SODIUM-DEXTROSE 2-4 GM/100ML-% IV SOLN
INTRAVENOUS | Status: AC
  Filled 2023-08-16: qty 100

## 2023-08-16 MED ORDER — CEFAZOLIN SODIUM-DEXTROSE 2-4 GM/100ML-% IV SOLN
2.0000 g | Freq: Three times a day (TID) | INTRAVENOUS | Status: AC
  Administered 2023-08-16 – 2023-08-17 (×2): 2 g via INTRAVENOUS
  Filled 2023-08-16 (×2): qty 100

## 2023-08-16 MED ORDER — THROMBIN (RECOMBINANT) 5000 UNITS EX SOLR
CUTANEOUS | Status: DC | PRN
  Administered 2023-08-16: 10 mL via TOPICAL

## 2023-08-16 MED ORDER — MIDAZOLAM HCL 2 MG/2ML IJ SOLN
INTRAMUSCULAR | Status: DC | PRN
  Administered 2023-08-16: 1 mg via INTRAVENOUS

## 2023-08-16 MED ORDER — SUGAMMADEX SODIUM 200 MG/2ML IV SOLN
INTRAVENOUS | Status: DC | PRN
  Administered 2023-08-16: 300 mg via INTRAVENOUS

## 2023-08-16 MED ORDER — ULTRA OMEGA 3 952 MG PO CAPS: ORAL_CAPSULE | Freq: Every evening | ORAL | Status: DC

## 2023-08-16 MED ORDER — ONDANSETRON HCL 4 MG PO TABS: 4.0000 mg | ORAL_TABLET | Freq: Four times a day (QID) | ORAL | Status: DC | PRN

## 2023-08-16 MED ORDER — HYDROMORPHONE HCL 1 MG/ML IJ SOLN
0.2500 mg | INTRAMUSCULAR | Status: DC | PRN
  Administered 2023-08-16: 0.5 mg via INTRAVENOUS
  Administered 2023-08-16: 0.25 mg via INTRAVENOUS

## 2023-08-16 MED ORDER — THROMBIN 5000 UNITS EX SOLR
OROMUCOSAL | Status: DC | PRN
  Administered 2023-08-16: 5 mL via TOPICAL

## 2023-08-16 MED ORDER — METOPROLOL TARTRATE 5 MG/5ML IV SOLN
INTRAVENOUS | Status: DC | PRN
  Administered 2023-08-16 (×2): 1 mg via INTRAVENOUS

## 2023-08-16 MED ORDER — MEPERIDINE HCL 25 MG/ML IJ SOLN: 6.2500 mg | INTRAMUSCULAR | Status: DC | PRN

## 2023-08-16 MED ORDER — ACETAMINOPHEN 650 MG RE SUPP: 650.0000 mg | RECTAL | Status: DC | PRN

## 2023-08-16 MED ORDER — HYDROMORPHONE HCL 1 MG/ML IJ SOLN
INTRAMUSCULAR | Status: AC
Start: 2023-08-16 — End: 2023-08-16
  Filled 2023-08-16: qty 0.5

## 2023-08-16 MED ORDER — CHLORHEXIDINE GLUCONATE 0.12 % MT SOLN
15.0000 mL | Freq: Once | OROMUCOSAL | Status: AC
  Administered 2023-08-16: 15 mL via OROMUCOSAL
  Filled 2023-08-16: qty 15

## 2023-08-16 MED ORDER — PANTOPRAZOLE SODIUM 40 MG PO TBEC: 40.0000 mg | DELAYED_RELEASE_TABLET | Freq: Every day | ORAL | Status: DC

## 2023-08-16 MED ORDER — SCOPOLAMINE 1 MG/3DAYS TD PT72
1.0000 | MEDICATED_PATCH | TRANSDERMAL | Status: DC
  Administered 2023-08-16: 1.5 mg via TRANSDERMAL
  Filled 2023-08-16: qty 1

## 2023-08-16 MED ORDER — MIDAZOLAM HCL 2 MG/2ML IJ SOLN
INTRAMUSCULAR | Status: AC
  Filled 2023-08-16: qty 2

## 2023-08-16 MED ORDER — ACETAMINOPHEN 500 MG PO TABS
1000.0000 mg | ORAL_TABLET | Freq: Once | ORAL | Status: AC
  Administered 2023-08-16: 1000 mg via ORAL

## 2023-08-16 MED ORDER — SODIUM CHLORIDE 0.9% FLUSH
3.0000 mL | INTRAVENOUS | Status: DC | PRN
Start: 2023-08-16 — End: 2023-08-17

## 2023-08-16 MED ORDER — ONDANSETRON HCL 4 MG/2ML IJ SOLN
INTRAMUSCULAR | Status: AC
  Filled 2023-08-16: qty 2

## 2023-08-16 MED ORDER — ROCURONIUM BROMIDE 10 MG/ML (PF) SYRINGE
PREFILLED_SYRINGE | INTRAVENOUS | Status: AC
  Filled 2023-08-16: qty 10

## 2023-08-16 MED ORDER — SODIUM CHLORIDE 0.9% FLUSH
3.0000 mL | Freq: Two times a day (BID) | INTRAVENOUS | Status: DC
  Administered 2023-08-16: 3 mL via INTRAVENOUS

## 2023-08-16 MED ORDER — HYDROMORPHONE HCL 1 MG/ML IJ SOLN
INTRAMUSCULAR | Status: DC | PRN
  Administered 2023-08-16: .5 mg via INTRAVENOUS

## 2023-08-16 MED ORDER — ONDANSETRON HCL 4 MG/2ML IJ SOLN: 4.0000 mg | Freq: Four times a day (QID) | INTRAMUSCULAR | Status: DC | PRN

## 2023-08-16 MED ORDER — PROPOFOL 500 MG/50ML IV EMUL
INTRAVENOUS | Status: DC | PRN
  Administered 2023-08-16: 40 ug/kg/min via INTRAVENOUS

## 2023-08-16 MED ORDER — ONDANSETRON HCL 4 MG/2ML IJ SOLN
INTRAMUSCULAR | Status: DC | PRN
  Administered 2023-08-16: 4 mg via INTRAVENOUS

## 2023-08-16 MED ORDER — DEXAMETHASONE SODIUM PHOSPHATE 10 MG/ML IJ SOLN
INTRAMUSCULAR | Status: AC
  Filled 2023-08-16: qty 1

## 2023-08-16 MED ORDER — DEXMEDETOMIDINE HCL IN NACL 80 MCG/20ML IV SOLN
INTRAVENOUS | Status: AC
Start: 2023-08-16 — End: 2023-08-16
  Filled 2023-08-16: qty 20

## 2023-08-16 MED ORDER — VITAMIN D 25 MCG (1000 UNIT) PO TABS
2000.0000 [IU] | ORAL_TABLET | Freq: Every morning | ORAL | Status: DC
  Administered 2023-08-17: 2000 [IU] via ORAL
  Filled 2023-08-16: qty 2

## 2023-08-16 MED ORDER — OXYCODONE HCL 5 MG/5ML PO SOLN
5.0000 mg | Freq: Once | ORAL | Status: AC | PRN
  Administered 2023-08-16: 5 mg via ORAL

## 2023-08-16 MED ORDER — HYDROCODONE-ACETAMINOPHEN 7.5-325 MG PO TABS
1.0000 | ORAL_TABLET | Freq: Four times a day (QID) | ORAL | Status: DC | PRN
  Administered 2023-08-17: 1 via ORAL
  Filled 2023-08-16: qty 1

## 2023-08-16 MED ORDER — HYDROMORPHONE HCL 1 MG/ML IJ SOLN: 0.5000 mg | INTRAMUSCULAR | Status: DC | PRN

## 2023-08-16 MED ORDER — ACETAMINOPHEN 500 MG PO TABS
ORAL_TABLET | ORAL | Status: AC
  Filled 2023-08-16: qty 2

## 2023-08-16 MED ORDER — TRAMADOL HCL 50 MG PO TABS
50.0000 mg | ORAL_TABLET | Freq: Four times a day (QID) | ORAL | Status: DC | PRN
  Administered 2023-08-16 – 2023-08-17 (×2): 50 mg via ORAL
  Filled 2023-08-16 (×2): qty 1

## 2023-08-16 MED ORDER — LIDOCAINE 2% (20 MG/ML) 5 ML SYRINGE
INTRAMUSCULAR | Status: DC | PRN
  Administered 2023-08-16: 40 mg via INTRAVENOUS

## 2023-08-16 MED ORDER — MENTHOL 3 MG MT LOZG: 1.0000 | LOZENGE | OROMUCOSAL | Status: DC | PRN

## 2023-08-16 MED ORDER — FENTANYL CITRATE (PF) 250 MCG/5ML IJ SOLN
INTRAMUSCULAR | Status: AC
  Filled 2023-08-16: qty 5

## 2023-08-16 MED ORDER — PANTOPRAZOLE SODIUM 40 MG IV SOLR: 40.0000 mg | Freq: Every day | INTRAVENOUS | Status: DC

## 2023-08-16 MED ORDER — ASPIRIN 81 MG PO TBEC
81.0000 mg | DELAYED_RELEASE_TABLET | Freq: Every evening | ORAL | Status: DC
  Administered 2023-08-16: 81 mg via ORAL
  Filled 2023-08-16: qty 1

## 2023-08-16 SURGICAL SUPPLY — 53 items
BAG COUNTER SPONGE SURGICOUNT (BAG) ×1 IMPLANT
BAND RUBBER #18 3X1/16 STRL (MISCELLANEOUS) ×2 IMPLANT
BASKET BONE COLLECTION (BASKET) ×1 IMPLANT
BENZOIN TINCTURE PRP APPL 2/3 (GAUZE/BANDAGES/DRESSINGS) ×1 IMPLANT
BIT DRILL NEURO 2X3.1 SFT TUCH (MISCELLANEOUS) ×1 IMPLANT
BUR MATCHSTICK NEURO 3.0 LAGG (BURR) ×1 IMPLANT
CANISTER SUCTION 3000ML PPV (SUCTIONS) ×1 IMPLANT
DERMABOND ADVANCED .7 DNX12 (GAUZE/BANDAGES/DRESSINGS) IMPLANT
DRAPE C-ARM 42X72 X-RAY (DRAPES) ×2 IMPLANT
DRAPE LAPAROTOMY 100X72 PEDS (DRAPES) ×1 IMPLANT
DRAPE MICROSCOPE SLANT 54X150 (MISCELLANEOUS) ×1 IMPLANT
DRSG OPSITE POSTOP 4X6 (GAUZE/BANDAGES/DRESSINGS) IMPLANT
DURAPREP 6ML APPLICATOR 50/CS (WOUND CARE) ×1 IMPLANT
ELECT COATED BLADE 2.86 ST (ELECTRODE) ×1 IMPLANT
ELECTRODE REM PT RTRN 9FT ADLT (ELECTROSURGICAL) ×1 IMPLANT
GAUZE 4X4 16PLY ~~LOC~~+RFID DBL (SPONGE) IMPLANT
GAUZE SPONGE 4X4 12PLY STRL (GAUZE/BANDAGES/DRESSINGS) ×1 IMPLANT
GLOVE BIO SURGEON STRL SZ 6.5 (GLOVE) IMPLANT
GLOVE BIO SURGEON STRL SZ7 (GLOVE) IMPLANT
GLOVE BIO SURGEON STRL SZ8 (GLOVE) ×1 IMPLANT
GLOVE BIOGEL PI IND STRL 7.0 (GLOVE) IMPLANT
GLOVE BIOGEL PI IND STRL 7.5 (GLOVE) IMPLANT
GLOVE EXAM NITRILE XL STR (GLOVE) IMPLANT
GLOVE INDICATOR 8.5 STRL (GLOVE) ×1 IMPLANT
GLOVE SURG SS PI 6.5 STRL IVOR (GLOVE) IMPLANT
GOWN STRL REIN 3XL LVL4 (GOWN DISPOSABLE) IMPLANT
GOWN STRL REUS W/ TWL LRG LVL3 (GOWN DISPOSABLE) ×1 IMPLANT
GOWN STRL REUS W/ TWL XL LVL3 (GOWN DISPOSABLE) ×1 IMPLANT
GOWN STRL REUS W/TWL 2XL LVL3 (GOWN DISPOSABLE) IMPLANT
GRAFT BNE MATRIX VG FRMBL SM 1 (Bone Implant) IMPLANT
HALTER HD/CHIN CERV TRACTION D (MISCELLANEOUS) ×1 IMPLANT
HEMOSTAT POWDER KIT SURGIFOAM (HEMOSTASIS) ×1 IMPLANT
KIT BASIN OR (CUSTOM PROCEDURE TRAY) ×1 IMPLANT
KIT TURNOVER KIT B (KITS) ×1 IMPLANT
NDL SPNL 20GX3.5 QUINCKE YW (NEEDLE) ×1 IMPLANT
NEEDLE SPNL 20GX3.5 QUINCKE YW (NEEDLE) ×1 IMPLANT
NS IRRIG 1000ML POUR BTL (IV SOLUTION) ×1 IMPLANT
PACK LAMINECTOMY NEURO (CUSTOM PROCEDURE TRAY) ×1 IMPLANT
PAD ARMBOARD POSITIONER FOAM (MISCELLANEOUS) ×3 IMPLANT
PIN DISTRACTION 14MM (PIN) IMPLANT
PLATE ACP 1.6X36 2LVL (Plate) IMPLANT
SCREW ACP VA SD 3.5X15 (Screw) IMPLANT
SPACER HEDRON 14X16X6 0D (Spacer) IMPLANT
SPACER HEDRON 14X16X7 0D (Spacer) IMPLANT
SPONGE INTESTINAL PEANUT (DISPOSABLE) ×1 IMPLANT
SPONGE SURGIFOAM ABS GEL SZ50 (HEMOSTASIS) ×1 IMPLANT
STRIP CLOSURE SKIN 1/2X4 (GAUZE/BANDAGES/DRESSINGS) ×1 IMPLANT
SUT VIC AB 3-0 SH 8-18 (SUTURE) ×1 IMPLANT
SUT VIC AB 4-0 PS2 18 (SUTURE) ×1 IMPLANT
TAPE CLOTH 4X10 WHT NS (GAUZE/BANDAGES/DRESSINGS) ×1 IMPLANT
TOWEL GREEN STERILE (TOWEL DISPOSABLE) ×1 IMPLANT
TOWEL GREEN STERILE FF (TOWEL DISPOSABLE) ×1 IMPLANT
WATER STERILE IRR 1000ML POUR (IV SOLUTION) ×1 IMPLANT

## 2023-08-16 NOTE — Anesthesia Preprocedure Evaluation (Addendum)
 Anesthesia Evaluation  Patient identified by MRN, date of birth, ID band Patient awake    Reviewed: Allergy & Precautions, NPO status , Patient's Chart, lab work & pertinent test results  History of Anesthesia Complications Negative for: history of anesthetic complications  Airway Mallampati: II  TM Distance: >3 FB Neck ROM: Full    Dental  (+) Edentulous Upper, Missing, Dental Advisory Given   Pulmonary Current Smoker and Patient abstained from smoking.   breath sounds clear to auscultation       Cardiovascular hypertension, Pt. on medications (-) angina  Rhythm:Regular Rate:Normal     Neuro/Psych   Anxiety     Chronic back pain: narcotics    GI/Hepatic Neg liver ROS,GERD  Medicated and Controlled,,  Endo/Other    Class 3 obesityBMI 40  Renal/GU negative Renal ROS     Musculoskeletal  (+) Arthritis ,    Abdominal   Peds  Hematology Hb 14.7, plt 240k   Anesthesia Other Findings   Reproductive/Obstetrics                              Anesthesia Physical Anesthesia Plan  ASA: 3  Anesthesia Plan: General   Post-op Pain Management: Tylenol  PO (pre-op)*   Induction: Intravenous  PONV Risk Score and Plan: 3 and Ondansetron , Dexamethasone  and Scopolamine  patch - Pre-op  Airway Management Planned: Oral ETT and Video Laryngoscope Planned  Additional Equipment: None  Intra-op Plan:   Post-operative Plan: Extubation in OR  Informed Consent: I have reviewed the patients History and Physical, chart, labs and discussed the procedure including the risks, benefits and alternatives for the proposed anesthesia with the patient or authorized representative who has indicated his/her understanding and acceptance.     Dental advisory given  Plan Discussed with: CRNA and Surgeon  Anesthesia Plan Comments:          Anesthesia Quick Evaluation

## 2023-08-16 NOTE — H&P (Signed)
 Megan Maldonado is an 63 y.o. female.   Chief Complaint: Neck pain arm pain numbness in her hands HPI: 63 year old female progressive worsening neck pain bilateral shoulder and arm pain numbness in her hands workup revealed spinal cord compression with disc herniation at C5-6 with spondylosis C4-5.  Due to the patient's progression of clinical syndrome imaging findings of a conservative treatment recommended anterior cervical discectomies and fusion at those 2 levels.  We extensively reviewed the risks and benefits of the operation with the patient as well as perioperative course expectations of outcome and alternatives to surgery and she understands and agrees to proceed forward.  Past Medical History:  Diagnosis Date   Anxiety    Arthritis    Chronic back pain    HTN (hypertension)     Past Surgical History:  Procedure Laterality Date   BACK SURGERY  2008   CARPAL TUNNEL RELEASE Bilateral 2002   COLONOSCOPY WITH PROPOFOL  N/A 10/04/2017   Procedure: COLONOSCOPY WITH PROPOFOL ;  Surgeon: Shaaron Lamar HERO, MD;  Location: [facility];  Service: Endoscopy;  Laterality: N/A;  10:30am   POLYPECTOMY  10/04/2017   Procedure: POLYPECTOMY;  Surgeon: Shaaron Lamar HERO, MD;  Location: [facility];  Service: Endoscopy;;  rectal   TONSILLECTOMY      Family History  Problem Relation Age of Onset   Pancreatic cancer Maternal Grandmother    Colon cancer Neg Hx    Social History:  reports that she quit smoking about 6 years ago. Her smoking use included cigarettes. She started smoking about 31 years ago. She has a 25 pack-year smoking history. She has never used smokeless tobacco. She reports current alcohol use. She reports that she does not use drugs.  Allergies:  Allergies  Allergen Reactions   Ace Inhibitors     Unknown, family history of swelling and being on ventilator after taking this medication   Gluten Meal Diarrhea and Nausea And Vomiting    Medications Prior to Admission  Medication Sig  Dispense Refill   acetaminophen  (TYLENOL ) 650 MG CR tablet Take 650 mg by mouth every 8 (eight) hours as needed for pain.     Ascorbic Acid (VITAMIN C  WITH ROSE HIPS) 1000 MG tablet Take 1,000 mg by mouth in the morning and at bedtime.     Cholecalciferol  (VITAMIN D3 PO) Take 2,000 Units by mouth in the morning.     Coenzyme Q10 (COQ10) 200 MG CAPS Take 200 mg by mouth every evening.     cyanocobalamin  (VITAMIN B12) 1000 MCG tablet Take 1,000 mcg by mouth in the morning.     diazepam  (VALIUM ) 5 MG tablet Take 5 mg by mouth See admin instructions. Take 1 tablet (5 mg) by mouth scheduled every morning, may take up to 2 additional dose if needed for anxiety.     HYDROcodone -acetaminophen  (NORCO) 7.5-325 MG tablet Take 1 tablet by mouth See admin instructions. Take 1 tablet by mouth scheduled every morning, may every 6 hours as needed for pain (MAX 4 tablets/24hrs.)     losartan  (COZAAR ) 100 MG tablet Take 100 mg by mouth every evening.     meloxicam  (MOBIC ) 15 MG tablet Take 15 mg by mouth every evening.     Omega-3 Fatty Acids (OMEGA 3 PO) Take 2 capsules by mouth in the morning. Omega XL     Omega-3 Fatty Acids (ULTRA OMEGA 3 PO) Take 1 capsule by mouth every evening.     omeprazole (PRILOSEC) 20 MG capsule Take 20 mg by  mouth daily before breakfast.     rosuvastatin  (CRESTOR ) 5 MG tablet Take 5 mg by mouth in the morning.     traMADol  (ULTRAM ) 50 MG tablet Take 50 mg by mouth See admin instructions. Take 1 tablet (50 mg) by mouth scheduled every morning, may every 6 hours as needed for pain (MAX 4 tablets/24hrs.)     aspirin  EC 81 MG tablet Take 81 mg by mouth every evening.      No results found for this or any previous visit (from the past 48 hours). No results found.  Review of Systems  Musculoskeletal:  Positive for neck pain.  Neurological:  Positive for weakness and numbness.    Blood pressure (!) 158/88, pulse 80, temperature 98.1 F (36.7 C), temperature source Oral, resp. rate 18,  height 5' 1 (1.549 m), weight 95.3 kg, SpO2 95%. Physical Exam HENT:     Head: Normocephalic.     Right Ear: Tympanic membrane normal.     Nose: Nose normal.     Mouth/Throat:     Mouth: Mucous membranes are moist.  Cardiovascular:     Rate and Rhythm: Normal rate.  Musculoskeletal:        General: Normal range of motion.     Cervical back: Normal range of motion.  Skin:    General: Skin is warm.  Neurological:     Mental Status: She is alert.     Comments: Patient is weak tricipital 4+ or 5 otherwise has 5 out of 5 deltoid, bicep, wrist flexion, wrist extension, hand intrinsics      Assessment/Plan 63 year old presents for ACDF C4-5 C5-6  Arley SHAUNNA Helling, MD 08/16/2023, 1:18 PM

## 2023-08-16 NOTE — Anesthesia Procedure Notes (Signed)
 Procedure Name: Intubation Date/Time: 08/16/2023 2:53 PM  Performed by: Julien Manus, CRNAPre-anesthesia Checklist: Patient identified, Emergency Drugs available, Suction available and Patient being monitored Patient Re-evaluated:Patient Re-evaluated prior to induction Oxygen Delivery Method: Circle system utilized Preoxygenation: Pre-oxygenation with 100% oxygen Induction Type: IV induction Ventilation: Mask ventilation without difficulty Laryngoscope Size: Glidescope and 3 Grade View: Grade I Tube type: Oral Tube size: 7.0 mm Number of attempts: 1 Airway Equipment and Method: Oral airway and Rigid stylet Placement Confirmation: ETT inserted through vocal cords under direct vision, positive ETCO2 and breath sounds checked- equal and bilateral Tube secured with: Tape Dental Injury: Teeth and Oropharynx as per pre-operative assessment

## 2023-08-16 NOTE — Progress Notes (Signed)
 Patient ambulated in the hall with standby assistance and did well without complications.

## 2023-08-16 NOTE — Op Note (Signed)
 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  Focal spinal stenosis at C4-5 and C5-6  Postoperative diagnosis: Same.  Procedure: Anterior cervical discectomies and fusion C4-5 C5-6 utilizing globus titanium cages packed with locally harvested autograft mixed with Vivigen and anterior cervical plating utilizing the NuVasive ACP plate.  Surgeon: Arley helling.  Assistant: Suzen Click.  Anesthesia: General.  EBL: Minimal.  HPI: 63 year old female with severe neck and bilateral shoulder and arm pain workup revealed severe cervical spondylosis C4-5 C5-6 disc herniation C5-6 with cord compression due to the patient's progression of clinical syndrome imaging findings failed conservative treatment I recommended anterior cervical discectomy and fusion at C4-5 and C5-6.  I extensively reviewed the risks and benefits of the operation with the patient as well as perioperative course expectations of outcome and alternatives to surgery and she understood and agreed to proceed forward.  Operative procedure: Patient was brought into the OR was [place] general anesthesia positioned supine with neck in slight extension and 5 pounds of halter traction.  The right side of her neck was prepped and draped in routine sterile fashion.  Preoperative x-ray localized the appropriate level so a curvilinear incision was made just off midline to the intraportal sternocleidomastoid and superficial epithelium was dissected out divided longitudinally.  The avascular plane between send between the sternomastoid and strap muscle developed down to the prevertebral fascia and prevertebral fascia was dissected away with Kitners.  Interoperative x-ray confirmed identification appropriate level.  So annulotomy's were made with a 15 by scalpel to mark the disc base self-retaining retractors were placed after the longus was reflected laterally.  Both disc bases were then further incised scraped  with upgoing curettes and drill down with a high-speed drill.  Under microscopic lamination first working at C5-6 posterior ossified complexes were drilled away large posterior spurring was aggressively under [place]on and removed decompressing the spinal cord marching laterally both C6 pedicles were identified both C6 nerve roots were decompressed and skeletonized flush with the pedicle.  At the end discectomy there is no further stenosis either centrally or foraminally this was packed with Gelfoam attention taken at C4-5 in a similar fashion C4-5 was drilled down The Bone Shavings the Mucus Trap Aggressive Undermining Both Endplates Decompress the Central Canal Both C5 Nerve Roots Were Decompressed and Skeletonized Flush with the Pedicle.  I Sized up a 7 Mm Cage for C4-5 6 Mm for C5-6 Both Cages Were Packed with Locally Harvested Autograft Mixed with Vivigen and Inserted 1 to 2 Mm Deep to the Anterior Vertebral Line.  Then the NuVasive Plate Was Sized up to Use the 34 Mm NuVasive Plate All Screws with Excellent Purchase Locking Mechanism Was Engaged Then the Wound Was Copiously Irrigated Meticulous Hemostasis Was Maintained the Wound Was Then Closed with Interrupted Vicryl and the Potassium and a Running 4 Subcuticular.  Dermabond Benzoin Steri-Strips and Sterile Dressing Was Applied Patient to Cover Him in Stable Condition.  At the End the Case All Needle Count Sponge Counts Were Correct.

## 2023-08-16 NOTE — Transfer of Care (Signed)
 Immediate Anesthesia Transfer of Care Note  Patient: Megan Maldonado  Procedure(s) Performed: ANTERIOR CERVICAL DECOMPRESSION/DISCECTOMY AND FUSION CERVICAL FOUR-FIVE/CERVICAL  Patient Location: PACU  Anesthesia Type:General  Level of Consciousness: drowsy  Airway & Oxygen Therapy: Patient Spontanous Breathing and Patient connected to face mask oxygen  Post-op Assessment: Report given to RN and Post -op Vital signs reviewed and stable  Post vital signs: Reviewed and stable  Last Vitals:  Vitals Value Taken Time  BP 153/94 08/16/23 17:08  Temp 36.8 C 08/16/23 17:08  Pulse 97 08/16/23 17:14  Resp 18 08/16/23 17:14  SpO2 91 % 08/16/23 17:14  Vitals shown include unfiled device data.  Last Pain:  Vitals:   08/16/23 1254  TempSrc:   PainSc: 0-No pain         Complications: No notable events documented.

## 2023-08-16 NOTE — Anesthesia Postprocedure Evaluation (Signed)
 Anesthesia Post Note  Patient: Megan Maldonado  Procedure(s) Performed: ANTERIOR CERVICAL DECOMPRESSION/DISCECTOMY AND FUSION CERVICAL FOUR-FIVE/CERVICAL     Patient location during evaluation: PACU Anesthesia Type: General Level of consciousness: sedated, oriented and patient cooperative Pain management: pain level controlled Vital Signs Assessment: post-procedure vital signs reviewed and stable Respiratory status: spontaneous breathing, nonlabored ventilation, respiratory function stable and patient connected to nasal cannula oxygen Cardiovascular status: blood pressure returned to baseline and stable Postop Assessment: no apparent nausea or vomiting Anesthetic complications: no   No notable events documented.  Last Vitals:  Vitals:   08/16/23 1815 08/16/23 1830  BP: (!) 179/75 (!) 178/107  Pulse: 97 92  Resp: 18 16  Temp:    SpO2: 93% 91%    Last Pain:  Vitals:   08/16/23 1830  TempSrc:   PainSc: Asleep                 Khya Halls,E. Teren Franckowiak

## 2023-08-17 ENCOUNTER — Encounter (HOSPITAL_COMMUNITY): Payer: Self-pay | Admitting: Neurosurgery

## 2023-08-17 DIAGNOSIS — K219 Gastro-esophageal reflux disease without esophagitis: Secondary | ICD-10-CM | POA: Diagnosis not present

## 2023-08-17 DIAGNOSIS — G8929 Other chronic pain: Secondary | ICD-10-CM | POA: Diagnosis not present

## 2023-08-17 DIAGNOSIS — Z791 Long term (current) use of non-steroidal anti-inflammatories (NSAID): Secondary | ICD-10-CM | POA: Diagnosis not present

## 2023-08-17 DIAGNOSIS — M50122 Cervical disc disorder at C5-C6 level with radiculopathy: Secondary | ICD-10-CM | POA: Diagnosis not present

## 2023-08-17 DIAGNOSIS — M4712 Other spondylosis with myelopathy, cervical region: Secondary | ICD-10-CM | POA: Diagnosis not present

## 2023-08-17 DIAGNOSIS — Z6839 Body mass index (BMI) 39.0-39.9, adult: Secondary | ICD-10-CM | POA: Diagnosis not present

## 2023-08-17 DIAGNOSIS — F419 Anxiety disorder, unspecified: Secondary | ICD-10-CM | POA: Diagnosis not present

## 2023-08-17 DIAGNOSIS — M4802 Spinal stenosis, cervical region: Secondary | ICD-10-CM | POA: Diagnosis not present

## 2023-08-17 DIAGNOSIS — M549 Dorsalgia, unspecified: Secondary | ICD-10-CM | POA: Diagnosis not present

## 2023-08-17 DIAGNOSIS — Z79899 Other long term (current) drug therapy: Secondary | ICD-10-CM | POA: Diagnosis not present

## 2023-08-17 DIAGNOSIS — Z7982 Long term (current) use of aspirin: Secondary | ICD-10-CM | POA: Diagnosis not present

## 2023-08-17 DIAGNOSIS — R200 Anesthesia of skin: Secondary | ICD-10-CM | POA: Diagnosis not present

## 2023-08-17 DIAGNOSIS — M778 Other enthesopathies, not elsewhere classified: Secondary | ICD-10-CM | POA: Diagnosis not present

## 2023-08-17 DIAGNOSIS — I10 Essential (primary) hypertension: Secondary | ICD-10-CM | POA: Diagnosis not present

## 2023-08-17 DIAGNOSIS — M1990 Unspecified osteoarthritis, unspecified site: Secondary | ICD-10-CM | POA: Diagnosis not present

## 2023-08-17 DIAGNOSIS — M542 Cervicalgia: Secondary | ICD-10-CM | POA: Diagnosis not present

## 2023-08-17 DIAGNOSIS — Z87891 Personal history of nicotine dependence: Secondary | ICD-10-CM | POA: Diagnosis not present

## 2023-08-17 DIAGNOSIS — E66813 Obesity, class 3: Secondary | ICD-10-CM | POA: Diagnosis not present

## 2023-08-17 DIAGNOSIS — R531 Weakness: Secondary | ICD-10-CM | POA: Diagnosis not present

## 2023-08-17 MED ORDER — HYDROCODONE-ACETAMINOPHEN 7.5-325 MG PO TABS: 1.0000 | ORAL_TABLET | Freq: Four times a day (QID) | ORAL | 0 refills | Status: DC | PRN

## 2023-08-17 MED ORDER — CYCLOBENZAPRINE HCL 10 MG PO TABS: 10.0000 mg | ORAL_TABLET | Freq: Three times a day (TID) | ORAL | 0 refills | Status: DC | PRN

## 2023-08-17 MED FILL — Thrombin For Soln 5000 Unit: CUTANEOUS | Qty: 2 | Status: AC

## 2023-08-17 NOTE — Progress Notes (Signed)
 Patient alert and oriented, void and ambulate. Surgical site clean dry no sign of infection. D/c instructions explain and given all questions answered.

## 2023-08-17 NOTE — Evaluation (Signed)
 Occupational Therapy Evaluation Patient Details Name: Megan Maldonado MRN: [id number] DOB: [birth year] Today's Date: 08/17/2023   History of Present Illness   63 yo F s/p ACDF.  PMH includes HTN, back pain.     Clinical Impressions Patient admitted for scheduled ACDF.  Patient is presenting at baseline and doing quite well.  Good understanding of all precautions and no further OT needs in the acute setting.  Recommend follow up as prescribed by MD.       If plan is discharge home, recommend the following:   Assist for transportation     Functional Status Assessment   Patient has not had a recent decline in their functional status     Equipment Recommendations   None recommended by OT     Recommendations for Other Services         Precautions/Restrictions   Precautions Precautions: Cervical Precaution Booklet Issued: Yes (comment) Recall of Precautions/Restrictions: Intact Required Braces or Orthoses: Cervical Brace Cervical Brace: Soft collar;For comfort Restrictions Weight Bearing Restrictions Per Provider Order: No     Mobility Bed Mobility Overal bed mobility: Modified Independent                  Transfers Overall transfer level: Modified independent Equipment used: None                      Balance Overall balance assessment: No apparent balance deficits (not formally assessed)                                         ADL either performed or assessed with clinical judgement   ADL Overall ADL's : At baseline                                             Vision Patient Visual Report: No change from baseline       Perception Perception: Within Functional Limits       Praxis Praxis: WFL       Pertinent Vitals/Pain Pain Assessment Pain Assessment: Faces Faces Pain Scale: Hurts a little bit Pain Location: neck Pain Descriptors / Indicators: Aching Pain Intervention(s): Monitored during  session     Extremity/Trunk Assessment Upper Extremity Assessment Upper Extremity Assessment: Overall WFL for tasks assessed   Lower Extremity Assessment Lower Extremity Assessment: Overall WFL for tasks assessed   Cervical / Trunk Assessment Cervical / Trunk Assessment: Neck Surgery   Communication Communication Communication: No apparent difficulties   Cognition Arousal: Alert Behavior During Therapy: WFL for tasks assessed/performed Cognition: No apparent impairments                               Following commands: Intact       Cueing  General Comments   Cueing Techniques: Verbal cues   VSS on RA   Exercises     Shoulder Instructions      Home Living Family/patient expects to be discharged to:: Private residence Living Arrangements: Alone Available Help at Discharge: Family;Available PRN/intermittently Type of Home: Mobile home Home Access: Stairs to enter Entrance Stairs-Number of Steps: 4 Entrance Stairs-Rails: Left Home Layout: One level     Bathroom Shower/Tub: Chief Strategy Officer: Standard  Bathroom Accessibility: Yes   Home Equipment: Adaptive equipment Adaptive Equipment: Reacher;Long-handled sponge        Prior Functioning/Environment Prior Level of Function : Independent/Modified Independent;Driving                    OT Problem List: Pain   OT Treatment/Interventions:        OT Goals(Current goals can be found in the care plan section)   Acute Rehab OT Goals Patient Stated Goal: Return home OT Goal Formulation: With patient Time For Goal Achievement: 08/19/23 Potential to Achieve Goals: Good   OT Frequency:       Co-evaluation              AM-PAC OT 6 Clicks Daily Activity     Outcome Measure Help from another person eating meals?: None Help from another person taking care of personal grooming?: None Help from another person toileting, which includes using toliet, bedpan, or  urinal?: None Help from another person bathing (including washing, rinsing, drying)?: None Help from another person to put on and taking off regular upper body clothing?: None Help from another person to put on and taking off regular lower body clothing?: None 6 Click Score: 24   End of Session Equipment Utilized During Treatment: Cervical collar Nurse Communication: Mobility status  Activity Tolerance: Patient tolerated treatment well Patient left: in chair;with family/visitor present  OT Visit Diagnosis: Unsteadiness on feet (R26.81)                Time: 9189-9169 OT Time Calculation (min): 20 min Charges:  OT General Charges $OT Visit: 1 Visit OT Evaluation $OT Eval Moderate Complexity: 1 Mod  08/17/2023  RP, OTR/L  Acute Rehabilitation Services  Office:  [phone]   Megan Maldonado 08/17/2023, 8:36 AM

## 2023-08-17 NOTE — Discharge Summary (Signed)
 Physician Discharge Summary  Patient ID: Megan Maldonado MRN: [id number] DOB/AGE: January 06, 1961 63 y.o.  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 Focal spinal stenosis at C4-5 and C5-6    Discharge Diagnoses: same   Discharged Condition: good  Hospital Course: The patient was admitted on 08/16/2023 and taken to the operating room where the patient underwent acdf C4-5, C5-6. The patient tolerated the procedure well and was taken to the recovery room and then to the floor in stable condition. The hospital course was routine. There were no complications. The wound remained clean dry and intact. Pt had appropriate neck soreness. No complaints of arm pain or new N/T/W. The patient remained afebrile with stable vital signs, and tolerated a regular diet. The patient continued to increase activities, and pain was well controlled with oral pain medications.   Consults: None  Significant Diagnostic Studies:  Results for orders placed or performed during the hospital encounter of 08/10/23  Surgical pcr screen   Collection Time: 08/10/23  1:17 PM   Specimen: Nasal Mucosa; Nasal Swab  Result Value Ref Range   MRSA, PCR NEGATIVE NEGATIVE   Staphylococcus aureus NEGATIVE NEGATIVE  Basic metabolic panel per protocol   Collection Time: 08/10/23  1:47 PM  Result Value Ref Range   Sodium 140 135 - 145 mmol/L   Potassium 3.9 3.5 - 5.1 mmol/L   Chloride 103 98 - 111 mmol/L   CO2 28 22 - 32 mmol/L   Glucose, Bld 92 70 - 99 mg/dL   BUN 8 8 - 23 mg/dL   Creatinine, Ser 9.25 0.44 - 1.00 mg/dL   Calcium  9.8 8.9 - 10.3 mg/dL   GFR, Estimated >39 >39 mL/min   Anion gap 9 5 - 15  CBC per protocol   Collection Time: 08/10/23  1:47 PM  Result Value Ref Range   WBC 9.7 4.0 - 10.5 K/uL   RBC 4.42 3.87 - 5.11 MIL/uL   Hemoglobin 14.7 12.0 - 15.0 g/dL   HCT 56.8 63.9 - 53.9 %   MCV 97.5 80.0 - 100.0 fL   MCH 33.3  26.0 - 34.0 pg   MCHC 34.1 30.0 - 36.0 g/dL   RDW 87.4 88.4 - 84.4 %   Platelets 240 150 - 400 K/uL   nRBC 0.0 0.0 - 0.2 %    DG Cervical Spine 1 View Result Date: 08/16/2023 CLINICAL DATA:  Cervical decompression and fusion EXAM: DG CERVICAL SPINE - 1 VIEW COMPARISON:  07/05/2023 FLUOROSCOPY TIME:  Radiation Exposure Index (as provided by the fluoroscopic device): 2.17 mGy If the device does not provide the exposure index: Fluoroscopy Time:  8 seconds Number of Acquired Images:  3 FINDINGS: Initial image demonstrates localization at the C5 level anteriorly. Subsequently a needle was placed in the anterior aspect of the C4-5 interspace. Interbody fusion was then placed at C4-5 and C5-6 with anterior fixation. IMPRESSION: Cervical fusion from C4-C6. Electronically Signed   By: Oneil Devonshire M.D.   On: 08/16/2023 20:40   DG C-Arm 1-60 Min-No Report Result Date: 08/16/2023 Fluoroscopy was utilized by the requesting physician.  No radiographic interpretation.   DG C-Arm 1-60 Min-No Report Result Date: 08/16/2023 Fluoroscopy was utilized by the requesting physician.  No radiographic interpretation.   MM 3D SCREENING MAMMOGRAM BILATERAL BREAST Result Date: 08/11/2023 CLINICAL DATA:  Screening. EXAM: DIGITAL SCREENING BILATERAL MAMMOGRAM WITH TOMOSYNTHESIS AND CAD TECHNIQUE: Bilateral screening digital craniocaudal and mediolateral oblique mammograms were  obtained. Bilateral screening digital breast tomosynthesis was performed. The images were evaluated with computer-aided detection. COMPARISON:  Previous exam(s). ACR Breast Density Category a: The breasts are almost entirely fatty. FINDINGS: There are no findings suspicious for malignancy. IMPRESSION: No mammographic evidence of malignancy. A result letter of this screening mammogram will be mailed directly to the patient. RECOMMENDATION: Screening mammogram in one year. (Code:[id number]) BI-RADS CATEGORY  1: Negative. Electronically Signed   By: Dina  Arceo  M.D.   On: 08/11/2023 12:07    Antibiotics:  Anti-infectives (From admission, onward)    Start     Dose/Rate Route Frequency Ordered Stop   08/16/23 2200  ceFAZolin  (ANCEF ) IVPB 2g/100 mL premix        2 g 200 mL/hr over 30 Minutes Intravenous Every 8 hours 08/16/23 1906 08/17/23 0730   08/16/23 1345  ceFAZolin  (ANCEF ) IVPB 2g/100 mL premix        2 g 200 mL/hr over 30 Minutes Intravenous On call to O.R. 08/16/23 1333 08/16/23 1458   08/16/23 1335  ceFAZolin  (ANCEF ) 2-4 GM/100ML-% IVPB       Note to Pharmacy: Gleason, Ginger E: cabinet override      08/16/23 1335 08/16/23 1512       Discharge Exam: Blood pressure (!) 145/70, pulse 87, temperature 98.3 F (36.8 C), temperature source Oral, resp. rate 18, height 5' 1 (1.549 m), weight 95.3 kg, SpO2 93%. Neurologic: Grossly normal Ambulating and voiding well incision cdi   Discharge Medications:   Allergies as of 08/17/2023       Reactions   Ace Inhibitors    Unknown, family history of swelling and being on ventilator after taking this medication   Gluten Meal Diarrhea, Nausea And Vomiting        Medication List     STOP taking these medications    traMADol  50 MG tablet Commonly known as: ULTRAM        TAKE these medications    acetaminophen  650 MG CR tablet Commonly known as: TYLENOL  Take 650 mg by mouth every 8 (eight) hours as needed for pain.   aspirin  EC 81 MG tablet Take 81 mg by mouth every evening.   CoQ10 200 MG Caps Take 200 mg by mouth every evening.   cyanocobalamin  1000 MCG tablet Commonly known as: VITAMIN B12 Take 1,000 mcg by mouth in the morning.   cyclobenzaprine  10 MG tablet Commonly known as: FLEXERIL  Take 1 tablet (10 mg total) by mouth 3 (three) times daily as needed for muscle spasms.   diazepam  5 MG tablet Commonly known as: VALIUM  Take 5 mg by mouth See admin instructions. Take 1 tablet (5 mg) by mouth scheduled every morning, may take up to 2 additional dose if needed for  anxiety.   HYDROcodone -acetaminophen  7.5-325 MG tablet Commonly known as: NORCO Take 1 tablet by mouth every 6 (six) hours as needed for moderate pain (pain score 4-6). Take 1 tablet by mouth scheduled every morning, may every 6 hours as needed for pain (MAX 4 tablets/24hrs.) What changed:  when to take this reasons to take this   losartan  100 MG tablet Commonly known as: COZAAR  Take 100 mg by mouth every evening.   meloxicam  15 MG tablet Commonly known as: MOBIC  Take 15 mg by mouth every evening.   OMEGA 3 PO Take 2 capsules by mouth in the morning. Omega XL   ULTRA OMEGA 3 PO Take 1 capsule by mouth every evening.   omeprazole 20 MG capsule Commonly known as: PRILOSEC  Take 20 mg by mouth daily before breakfast.   rosuvastatin  5 MG tablet Commonly known as: CRESTOR  Take 5 mg by mouth in the morning.   vitamin C  with rose hips 1000 MG tablet Take 1,000 mg by mouth in the morning and at bedtime.   VITAMIN D3 PO Take 2,000 Units by mouth in the morning.        Disposition: home   Final Dx: acdf C4-5, C5-6  Discharge Instructions      Remove dressing in 72 hours   Complete by: As directed    Ambulatory Referral for Lung Cancer Scre   Complete by: As directed    Call MD for:  difficulty breathing, headache or visual disturbances   Complete by: As directed    Call MD for:  hives   Complete by: As directed    Call MD for:  persistant dizziness or light-headedness   Complete by: As directed    Call MD for:  persistant nausea and vomiting   Complete by: As directed    Call MD for:  redness, tenderness, or signs of infection (pain, swelling, redness, odor or green/yellow discharge around incision site)   Complete by: As directed    Call MD for:  severe uncontrolled pain   Complete by: As directed    Call MD for:  temperature >100.4   Complete by: As directed    Diet - low sodium heart healthy   Complete by: As directed    Driving Restrictions   Complete by:  As directed    No driving for 2 weeks, no riding in the car for 1 week   Increase activity slowly   Complete by: As directed    Lifting restrictions   Complete by: As directed    No lifting more than 8 lbs          Signed: Suzen Lacks Firmin Belisle 08/17/2023, 7:48 AM

## 2023-08-18 ENCOUNTER — Encounter (HOSPITAL_COMMUNITY): Payer: Self-pay | Admitting: Neurosurgery

## 2023-09-02 ENCOUNTER — Telehealth: Payer: Self-pay | Admitting: *Deleted

## 2023-09-02 ENCOUNTER — Other Ambulatory Visit: Payer: Self-pay | Admitting: *Deleted

## 2023-09-02 DIAGNOSIS — F17210 Nicotine dependence, cigarettes, uncomplicated: Secondary | ICD-10-CM

## 2023-09-02 DIAGNOSIS — Z87891 Personal history of nicotine dependence: Secondary | ICD-10-CM

## 2023-09-02 DIAGNOSIS — Z122 Encounter for screening for malignant neoplasm of respiratory organs: Secondary | ICD-10-CM

## 2023-09-02 NOTE — Telephone Encounter (Signed)
 Lung Cancer Screening Narrative/Criteria Questionnaire (Cigarette Smokers Only- No Cigars/Pipes/vapes)   Megan Maldonado   SDMV:09/07/23 9:30- Kristen                                           [birth year]              LDCT: 09/09/23 10:30- [facility]    63 y.o.   Phone: [phone]  Lung Screening Narrative (confirm age 65-77 yrs Medicare / 50-80 yrs Private pay insurance)   Insurance information:BCBS   Referring Provider:Cram   This screening involves an initial phone call with a team member from our program. It is called a shared decision making visit. The initial meeting is required by insurance and Medicare to make sure you understand the program. This appointment takes about 15-20 minutes to complete. The CT scan will completed at a separate date/time. This scan takes about 5-10 minutes to complete and you may eat and drink before and after the scan.  Criteria questions for Lung Cancer Screening:   Are you a current or former smoker? Current Age began smoking: 15   If you are a former smoker, what year did you quit smoking? Quit x 8 yrs(within 15 yrs)   To calculate your smoking history, I need an accurate estimate of how many packs of cigarettes you smoked per day and for how many years. (Not just the number of PPD you are now smoking)   Years smoking 40 x Packs per day 1/2 = Pack years 20   (at least 20 pack yrs)   (Make sure they understand that we need to know how much they have smoked in the past, not just the number of PPD they are smoking now)  Do you have a personal history of cancer?  No    Do you have a family history of cancer? Yes  (cancer type and and relative) MGM (pancreatic) Uncle(throat)   Are you coughing up blood?  No  Have you had unexplained weight loss of 15 lbs or more in the last 6 months? No  It looks like you meet all criteria.     Additional information: n/a

## 2023-09-07 ENCOUNTER — Ambulatory Visit (INDEPENDENT_AMBULATORY_CARE_PROVIDER_SITE_OTHER): Admitting: *Deleted

## 2023-09-07 DIAGNOSIS — Z87891 Personal history of nicotine dependence: Secondary | ICD-10-CM | POA: Diagnosis not present

## 2023-09-07 NOTE — Progress Notes (Signed)
   Virtual Visit via Video Note  I connected with Megan Maldonado on 09/07/23 at  9:30 AM EDT by a video enabled telemedicine application and verified that I am speaking with the correct person using two identifiers.  Location: Patient: in home Provider: [address] [place], [place], [address] Shared Decision Making Visit Lung Cancer Screening Program [phone])   Eligibility: Age 63 y.o. Pack Years Smoking History Calculation 20 (# packs/per year x # years smoked) Recent History of coughing up blood  no Unexplained weight loss? no ( >Than 15 pounds within the last 6 months ) Prior History Lung / other cancer no (Diagnosis within the last 5 years already requiring surveillance chest CT Scans). Smoking Status Former Smoker Former Smokers: Years since quit: 7 years  Quit Date: 09-2016  Visit Components: Discussion included one or more decision making aids. yes Discussion included risk/benefits of screening. yes Discussion included potential follow up diagnostic testing for abnormal scans. yes Discussion included meaning and risk of over diagnosis. yes Discussion included meaning and risk of False Positives. yes Discussion included meaning of total radiation exposure. yes  Counseling Included: Importance of adherence to annual lung cancer LDCT screening. yes Impact of comorbidities on ability to participate in the program. yes Ability and willingness to under diagnostic treatment. yes  Smoking Cessation Counseling: Current Smokers:  Discussed importance of smoking cessation. yes Information about tobacco cessation classes and interventions provided to patient. yes Patient provided with ticket for LDCT Scan. yes Symptomatic Patient. no  Counseling NA Diagnosis Code: Tobacco Use Z72.0 Asymptomatic Patient yes  Counseling (Intermediate counseling: > three minutes counseling) [id number] Counseled patient 3-4 minutes regarding tobacco use.   Former Smokers:  Discussed the  importance of maintaining cigarette abstinence. yes Diagnosis Code: Personal History of Nicotine Dependence. S12.108 Information about tobacco cessation classes and interventions provided to patient. Yes Patient provided with ticket for LDCT Scan. yes Written Order for Lung Cancer Screening with LDCT placed in Epic. Yes (CT Chest Lung Cancer Screening Low Dose W/O CM) [id number] Z12.2-Screening of respiratory organs Z87.891-Personal history of nicotine dependence   Josette Ranger, RN  09/07/23

## 2023-09-07 NOTE — Patient Instructions (Signed)

## 2023-09-09 ENCOUNTER — Ambulatory Visit
Admission: RE | Admit: 2023-09-09 | Discharge: 2023-09-09 | Disposition: A | Discharge status: Home / Self Care | Source: Ambulatory Visit | Attending: Acute Care | Admitting: Acute Care

## 2023-09-09 DIAGNOSIS — Z122 Encounter for screening for malignant neoplasm of respiratory organs: Secondary | ICD-10-CM

## 2023-09-09 DIAGNOSIS — F17210 Nicotine dependence, cigarettes, uncomplicated: Secondary | ICD-10-CM | POA: Diagnosis not present

## 2023-09-09 DIAGNOSIS — Z87891 Personal history of nicotine dependence: Secondary | ICD-10-CM

## 2023-09-10 DIAGNOSIS — M1991 Primary osteoarthritis, unspecified site: Secondary | ICD-10-CM | POA: Diagnosis not present

## 2023-09-10 DIAGNOSIS — E782 Mixed hyperlipidemia: Secondary | ICD-10-CM | POA: Diagnosis not present

## 2023-09-10 DIAGNOSIS — E559 Vitamin D deficiency, unspecified: Secondary | ICD-10-CM | POA: Diagnosis not present

## 2023-09-10 DIAGNOSIS — D518 Other vitamin B12 deficiency anemias: Secondary | ICD-10-CM | POA: Diagnosis not present

## 2023-09-10 DIAGNOSIS — G894 Chronic pain syndrome: Secondary | ICD-10-CM | POA: Diagnosis not present

## 2023-09-10 DIAGNOSIS — M159 Polyosteoarthritis, unspecified: Secondary | ICD-10-CM | POA: Diagnosis not present

## 2023-09-10 DIAGNOSIS — Z6838 Body mass index (BMI) 38.0-38.9, adult: Secondary | ICD-10-CM | POA: Diagnosis not present

## 2023-09-10 DIAGNOSIS — E039 Hypothyroidism, unspecified: Secondary | ICD-10-CM | POA: Diagnosis not present

## 2023-09-10 DIAGNOSIS — M5412 Radiculopathy, cervical region: Secondary | ICD-10-CM | POA: Diagnosis not present

## 2023-09-10 DIAGNOSIS — Z1331 Encounter for screening for depression: Secondary | ICD-10-CM | POA: Diagnosis not present

## 2023-09-10 DIAGNOSIS — Z9229 Personal history of other drug therapy: Secondary | ICD-10-CM | POA: Diagnosis not present

## 2023-09-10 DIAGNOSIS — Z0001 Encounter for general adult medical examination with abnormal findings: Secondary | ICD-10-CM | POA: Diagnosis not present

## 2023-09-10 DIAGNOSIS — I10 Essential (primary) hypertension: Secondary | ICD-10-CM | POA: Diagnosis not present

## 2023-09-10 DIAGNOSIS — T50905A Adverse effect of unspecified drugs, medicaments and biological substances, initial encounter: Secondary | ICD-10-CM | POA: Diagnosis not present

## 2023-09-10 DIAGNOSIS — E6609 Other obesity due to excess calories: Secondary | ICD-10-CM | POA: Diagnosis not present

## 2023-09-24 ENCOUNTER — Telehealth: Payer: Self-pay | Admitting: Family Medicine

## 2023-09-24 ENCOUNTER — Encounter: Payer: Self-pay | Admitting: Family Medicine

## 2023-09-24 NOTE — Telephone Encounter (Signed)
 LVM for patient to call back if virtual does not work for her for  9/25 appointment- if not will have to reschedule with another provider

## 2023-09-29 ENCOUNTER — Telehealth: Payer: Self-pay

## 2023-09-29 DIAGNOSIS — F17210 Nicotine dependence, cigarettes, uncomplicated: Secondary | ICD-10-CM

## 2023-09-29 DIAGNOSIS — Z87891 Personal history of nicotine dependence: Secondary | ICD-10-CM

## 2023-09-29 DIAGNOSIS — Z122 Encounter for screening for malignant neoplasm of respiratory organs: Secondary | ICD-10-CM

## 2023-09-29 NOTE — Addendum Note (Signed)
 Addended by: ANITRA AQUAS D on: 09/29/2023 04:28 PM   Modules accepted: Orders

## 2023-09-29 NOTE — Telephone Encounter (Signed)
-----   Message from Megan Maldonado sent at 09/29/2023  2:50 PM EDT ----- Megan Maldonado are seeing this patient 10/14/2023. Please follow up as you feel is clinically indicated regarding the Irregular hepatic capsule; liver only minimally included. Correlate with risk factors for cirrhosis and if this is a clinical concern, consider dedicated imaging. We will call her with the results of the scan. We will do a 12 month follow up scan which will be due in 08/2024. I see you already have her on statin for her age advanced CAD. Please let me know if you have any questions. Thanks so much   Lung-RADS 1, negative. Continue annual screening with low-dose chest CT without contrast in 12 months. 2. Esophageal air fluid level suggests dysmotility or gastroesophageal reflux. 3. Irregular hepatic capsule; liver only minimally included. Correlate with risk factors for cirrhosis and if this is a clinical concern, consider dedicated imaging. 4. Aortic Atherosclerosis (ICD10-I70.0) and Emphysema (ICD10-J43.9). 5. Age advanced coronary artery atherosclerosis. Recommend assessment of coronary risk factors.

## 2023-09-29 NOTE — Telephone Encounter (Signed)
 Called and spoke with pt and reviewed CT results and recommendations per Lauraine Lites, NP. PT verbalized understanding and will keep follow up appt with PCP to discuss liver findings. Order placed for 12 month lung screening CT.

## 2023-09-29 NOTE — Telephone Encounter (Signed)
 LVM for patient to call office and review recent Lung CT results.

## 2023-10-07 DIAGNOSIS — M5412 Radiculopathy, cervical region: Secondary | ICD-10-CM | POA: Diagnosis not present

## 2023-10-14 ENCOUNTER — Ambulatory Visit: Admitting: Family Medicine

## 2023-10-25 ENCOUNTER — Ambulatory Visit: Admitting: Nurse Practitioner

## 2023-11-24 ENCOUNTER — Encounter: Payer: Self-pay | Admitting: Student in an Organized Health Care Education/Training Program

## 2023-11-24 ENCOUNTER — Ambulatory Visit (INDEPENDENT_AMBULATORY_CARE_PROVIDER_SITE_OTHER): Admitting: Student in an Organized Health Care Education/Training Program

## 2023-11-24 VITALS — BP 181/93 | HR 100 | Wt 200.0 lb

## 2023-11-24 DIAGNOSIS — I10 Essential (primary) hypertension: Secondary | ICD-10-CM

## 2023-11-24 DIAGNOSIS — M48061 Spinal stenosis, lumbar region without neurogenic claudication: Secondary | ICD-10-CM

## 2023-11-24 DIAGNOSIS — E119 Type 2 diabetes mellitus without complications: Secondary | ICD-10-CM | POA: Insufficient documentation

## 2023-11-24 DIAGNOSIS — E782 Mixed hyperlipidemia: Secondary | ICD-10-CM

## 2023-11-24 DIAGNOSIS — E785 Hyperlipidemia, unspecified: Secondary | ICD-10-CM | POA: Insufficient documentation

## 2023-11-24 DIAGNOSIS — R7303 Prediabetes: Secondary | ICD-10-CM | POA: Insufficient documentation

## 2023-11-24 LAB — POCT URINE DRUG SCREEN
Methylenedioxyamphetamine: NOT DETECTED
POC Amphetamine UR: NOT DETECTED
POC BENZODIAZEPINES UR: POSITIVE — AB
POC Barbiturate UR: NOT DETECTED
POC Cocaine UR: NOT DETECTED
POC Ecstasy UR: NOT DETECTED
POC Marijuana UR: NOT DETECTED
POC Methadone UR: NOT DETECTED
POC Methamphetamine UR: NOT DETECTED
POC Opiate Ur: NOT DETECTED
POC Oxycodone UR: POSITIVE — AB
POC PHENCYCLIDINE UR: NOT DETECTED
POC TRICYCLICS UR: NOT DETECTED
URINE TEMPERATURE: 96 [degF] (ref 90.0–100.0)

## 2023-11-24 LAB — MICROALBUMIN / CREATININE URINE RATIO
Creatinine,U: 242.8 mg/dL
Microalb Creat Ratio: 23.8 mg/g (ref 0.0–30.0)
Microalb, Ur: 5.8 mg/dL — ABNORMAL HIGH (ref 0.0–1.9)

## 2023-11-24 LAB — HEMOGLOBIN A1C: Hgb A1c MFr Bld: 6.2 % (ref 4.6–6.5)

## 2023-11-24 MED ORDER — MELOXICAM 15 MG PO TABS: 15.0000 mg | ORAL_TABLET | Freq: Every day | ORAL | 2 refills | Status: DC | PRN

## 2023-11-24 MED ORDER — DIAZEPAM 5 MG PO TABS: 5.0000 mg | ORAL_TABLET | Freq: Every day | ORAL | 0 refills | Status: DC | PRN

## 2023-11-24 MED ORDER — TRAMADOL HCL 50 MG PO TABS: 50.0000 mg | ORAL_TABLET | Freq: Every day | ORAL | 0 refills | Status: DC | PRN

## 2023-11-24 NOTE — Assessment & Plan Note (Signed)
 I reviewed office notes from her prior physician.  He documented diabetes.  She reports that was temporary thing related to diet and is now much improved.  Will check an A1c today.

## 2023-11-24 NOTE — Assessment & Plan Note (Signed)
 Uncontrolled severe asymptomatic chronic hypertension.  Blood pressure very elevated today at 176/113.  I recommended better adherence with using losartan  100 mg daily.  Will check labs today.  She was very nervous about our discussion around her controlled substances.  Will continue with the losartan  for now.  Follow-up in 1 month, if still this elevated will consider adding thiazide and/or amlodipine.

## 2023-11-24 NOTE — Assessment & Plan Note (Signed)
 She experienced vertigo as an adverse reaction to Lipitor and is not on current lipid-lowering therapy due to adverse effects. There is a family history of cardiovascular disease.

## 2023-11-24 NOTE — Progress Notes (Signed)
 New Patient Office Visit  Patient ID: CELINA SHILEY, Female   DOB: 08-05-1960 63 y.o. MRN: [id number]  Chief Complaint  Patient presents with   Establish Care    Discuss cholesterol medication and refills on all meds   Subjective:     LORAY AKARD presents to establish care  HPI  Discussed the use of AI scribe software for clinical note transcription with the patient, who gave verbal consent to proceed.  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  She experiences chronic pain and discomfort in her lumbar and cervical spine, which has been treated with surgeries. She has been using centrally acting medications for many years. Currently, she is out of her medications, including meloxicam , hydrocodone , and tramadol , leading to increased stress and pain.  She underwent back surgery 17 years ago and continues to experience back pain with occasional spasms. She uses diazepam  for muscle spasms, which she finds effective. She has tried other muscle relaxers like Flexeril , which caused significant side effects such as dizziness and difficulty walking. She prefers hydrocodone  and diazepam , which have been effective for her over the years.  She has a history of high cholesterol and was previously on Lipitor, which caused severe vertigo, leading her to discontinue the medication two months ago. She has no history of heart attack or stroke, but there is a family history of heart disease and high blood pressure.  She lives alone in an RV and has been on disability for 5-6 years due to her back condition. 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 Her family, including a daughter, son-in-law, and grandchildren, live nearby.  She had a past issue with high blood sugar, which she managed  to control through lifestyle changes. She has been losing weight gradually and is currently down to 200 pounds from 230 pounds over the past couple of years. She maintains a diet rich in fruits and vegetables and has access to good food.   Outpatient Encounter Medications as of 11/24/2023  Medication Sig   losartan  (COZAAR ) 100 MG tablet Take 100 mg by mouth every evening.   omeprazole (PRILOSEC) 20 MG capsule Take 20 mg by mouth daily before breakfast.   rosuvastatin  (CRESTOR ) 20 MG tablet Take 20 mg by mouth daily.   [DISCONTINUED] acetaminophen  (TYLENOL ) 650 MG CR tablet Take 650 mg by mouth every 8 (eight) hours as needed for pain.   [DISCONTINUED] Ascorbic Acid (VITAMIN C  WITH ROSE HIPS) 1000 MG tablet Take 1,000 mg by mouth in the morning and at bedtime.   [DISCONTINUED] aspirin  EC 81 MG tablet Take 81 mg by mouth every evening.   [DISCONTINUED] calcium  carbonate (CALCIUM  600) 600 MG TABS tablet Take 600 mg by mouth 2 (two) times daily with a meal.   [DISCONTINUED] Cholecalciferol  (VITAMIN D3 PO) Take 2,000 Units by mouth in the morning.   [DISCONTINUED] Coenzyme Q10 (COQ10) 200 MG CAPS Take 200 mg by mouth every evening.   [DISCONTINUED] diazepam  (VALIUM ) 5 MG tablet Take 5 mg by mouth See admin instructions. Take 1 tablet (5 mg) by mouth scheduled every morning, may take up to 2 additional dose if needed for anxiety.   [DISCONTINUED] HYDROcodone -acetaminophen  (NORCO) 7.5-325 MG tablet Take 1 tablet by mouth every 6 (six) hours as needed for moderate pain (pain  score 4-6). Take 1 tablet by mouth scheduled every morning, may every 6 hours as needed for pain (MAX 4 tablets/24hrs.)   [DISCONTINUED] meloxicam  (MOBIC ) 15 MG tablet Take 15 mg by mouth every evening.   [DISCONTINUED] Omega-3 Fatty Acids (OMEGA 3 PO) Take 2 capsules by mouth in the morning. Omega XL   [DISCONTINUED] traMADol  (ULTRAM ) 50 MG tablet Take 50 mg by mouth every 6 (six) hours as needed for moderate pain (pain score 4-6).    diazepam  (VALIUM ) 5 MG tablet Take 1 tablet (5 mg total) by mouth daily as needed for anxiety.   meloxicam  (MOBIC ) 15 MG tablet Take 1 tablet (15 mg total) by mouth daily as needed for pain.   traMADol  (ULTRAM ) 50 MG tablet Take 1 tablet (50 mg total) by mouth daily as needed for moderate pain (pain score 4-6).   [DISCONTINUED] cyanocobalamin  (VITAMIN B12) 1000 MCG tablet Take 1,000 mcg by mouth in the morning. (Patient not taking: Reported on 11/24/2023)   [DISCONTINUED] cyclobenzaprine  (FLEXERIL ) 10 MG tablet Take 1 tablet (10 mg total) by mouth 3 (three) times daily as needed for muscle spasms. (Patient not taking: Reported on 11/24/2023)   [DISCONTINUED] Omega-3 Fatty Acids (ULTRA OMEGA 3 PO) Take 1 capsule by mouth every evening. (Patient not taking: Reported on 11/24/2023)   [DISCONTINUED] rosuvastatin  (CRESTOR ) 5 MG tablet Take 5 mg by mouth in the morning. (Patient not taking: Reported on 11/24/2023)   No facility-administered encounter medications on file as of 11/24/2023.    Past Medical History:  Diagnosis Date   Anxiety    Arthritis    Chronic back pain    HTN (hypertension)     Past Surgical History:  Procedure Laterality Date   ANTERIOR CERVICAL DECOMP/DISCECTOMY FUSION N/A 08/16/2023   Procedure: ANTERIOR CERVICAL DECOMPRESSION/DISCECTOMY AND FUSION CERVICAL FOUR-FIVE/CERVICAL FIVE-SIX;  Surgeon: Onetha Kuba, MD;  Location: [facility] OR;  Service: Neurosurgery;  Laterality: N/A;  ANTERIOR CERVICAL DECOMPRESSION/DISCECTOMY AND FUSION CERVICAL FOUR-FIVE/CERVICAL FOUR-FIVE/FIVE-SIX   BACK SURGERY  2008   CARPAL TUNNEL RELEASE Bilateral 2002   COLONOSCOPY WITH PROPOFOL  N/A 10/04/2017   Procedure: COLONOSCOPY WITH PROPOFOL ;  Surgeon: Shaaron Lamar HERO, MD;  Location: [facility];  Service: Endoscopy;  Laterality: N/A;  10:30am   POLYPECTOMY  10/04/2017   Procedure: POLYPECTOMY;  Surgeon: Shaaron Lamar HERO, MD;  Location: [facility];  Service: Endoscopy;;  rectal   TONSILLECTOMY      Family  History  Problem Relation Age of Onset   Pancreatic cancer Maternal Grandmother    Colon cancer Neg Hx         Objective:    BP (!) 181/93   Pulse 100   Wt 200 lb (90.7 kg)   SpO2 97%   BMI 37.79 kg/m   Physical Exam  Gen: Well-appearing older woman Eyes: Normal Ears; normal hearing Neck: Normal thyroid , no nodules or adenopathy Heart: Regular, 2 out of 6 early systolic murmur best heard at the right upper sternal border Lungs: Unlabored, clear throughout Abd: Soft, nontender, no hernias, no organomegaly Ext: Warm, no edema Neuro: Alert, conversational, not sedated, full strength upper and lower extremities, mildly slow get up and go, got to the table without assistance, normal gait and balance Psych: Appropriate mood and affect, not anxious or depressed appearing, tangential thoughts at times, speech is normal     Assessment & Plan:   Problem List Items Addressed This Visit       High   Hypertension - Primary (Chronic)   Uncontrolled severe  asymptomatic chronic hypertension.  Blood pressure very elevated today at 176/113.  I recommended better adherence with using losartan  100 mg daily.  Will check labs today.  She was very nervous about our discussion around her controlled substances.  Will continue with the losartan  for now.  Follow-up in 1 month, if still this elevated will consider adding thiazide and/or amlodipine.      Relevant Medications   rosuvastatin  (CRESTOR ) 20 MG tablet   Other Relevant Orders   Basic metabolic panel with GFR   Microalbumin / creatinine urine ratio     Medium    Spinal stenosis of lumbar region (Chronic)   Chronic pain is managed with hydrocodone  and diazepam , with tramadol  as needed. I have concerns about the interaction between diazepam  and hydrocodone  due to age-related risks, but she prefers the current regimen for functionality. Tramadol  and diazepam  are considered safer. Prescribed tramadol  50 mg, 30 tablets for one month, and  diazepam  as needed. Schedule a follow-up in one month to assess pain management and medication efficacy.  Urine drug testing today was appropriate.  I reviewed the controlled database, showing somewhat hide dispense amounts for the frequency of refills.  I counseled her that I would only prescribe 30-day supplies at a time.      Relevant Medications   traMADol  (ULTRAM ) 50 MG tablet   diazepam  (VALIUM ) 5 MG tablet   meloxicam  (MOBIC ) 15 MG tablet   Other Relevant Orders   POCT Urine Drug Screen (Completed)   Hyperlipidemia (Chronic)   She experienced vertigo as an adverse reaction to Lipitor and is not on current lipid-lowering therapy due to adverse effects. There is a family history of cardiovascular disease.      Relevant Medications   rosuvastatin  (CRESTOR ) 20 MG tablet   Other Relevant Orders   Lipid panel   Prediabetes   I reviewed office notes from her prior physician.  He documented diabetes.  She reports that was temporary thing related to diet and is now much improved.  Will check an A1c today.      Relevant Orders   Hemoglobin A1c    Return in about 4 weeks (around 12/22/2023).   Cleatus Debby Specking, MD [facility]

## 2023-11-24 NOTE — Assessment & Plan Note (Signed)
 Chronic pain is managed with hydrocodone  and diazepam , with tramadol  as needed. I have concerns about the interaction between diazepam  and hydrocodone  due to age-related risks, but she prefers the current regimen for functionality. Tramadol  and diazepam  are considered safer. Prescribed tramadol  50 mg, 30 tablets for one month, and diazepam  as needed. Schedule a follow-up in one month to assess pain management and medication efficacy.  Urine drug testing today was appropriate.  I reviewed the controlled database, showing somewhat hide dispense amounts for the frequency of refills.  I counseled her that I would only prescribe 30-day supplies at a time.

## 2023-11-24 NOTE — Patient Instructions (Signed)
  VISIT SUMMARY: Today, you came in for a follow-up on your chronic pain management and to discuss your medication regimen. We reviewed your history of lumbar and cervical spine arthritis, your past surgeries, and your current medications. We also discussed your high cholesterol and blood pressure management.  YOUR PLAN: -CHRONIC PAIN DUE TO LUMBAR AND CERVICAL SPINE ARTHRITIS AND LUMBAR SPINAL STENOSIS, POST-SURGICAL STATE: Chronic pain from arthritis in your spine and past surgeries is being managed with hydrocodone , diazepam , and tramadol . We will continue with tramadol  50 mg (30 tablets for one month) and diazepam  as needed, but be cautious about mixing diazepam  with hydrocodone . We will reassess your pain management and medication effectiveness in one month. A urine drug test will be ordered to monitor your controlled medications.  -ESSENTIAL HYPERTENSION: Essential hypertension is high blood pressure with no identifiable cause. Given your family history of hypertension and stroke, we will continue to monitor your blood pressure closely.  -MIXED HYPERLIPIDEMIA: Mixed hyperlipidemia is having high levels of different types of fats in your blood. You had an adverse reaction to Lipitor, so you are not currently on any lipid-lowering therapy. We will need to find an alternative treatment to manage your cholesterol levels due to your family history of cardiovascular disease.  INSTRUCTIONS: Please schedule a follow-up appointment in one month to assess your pain management and medication efficacy. Additionally, a urine drug test will be ordered to monitor your controlled medications.

## 2023-11-25 LAB — LIPID PANEL
Cholesterol: 250 mg/dL — ABNORMAL HIGH (ref 0–200)
HDL: 44.1 mg/dL (ref >39.00)
LDL Cholesterol: 175 mg/dL — ABNORMAL HIGH (ref 0–99)
NonHDL: 205.83
Total CHOL/HDL Ratio: 6
Triglycerides: 156 mg/dL — ABNORMAL HIGH (ref 0.0–149.0)
VLDL: 31.2 mg/dL (ref 0.0–40.0)

## 2023-11-25 LAB — BASIC METABOLIC PANEL WITH GFR
BUN: 18 mg/dL (ref 6–23)
CO2: 28 meq/L (ref 19–32)
Calcium: 10.2 mg/dL (ref 8.4–10.5)
Chloride: 102 meq/L (ref 96–112)
Creatinine, Ser: 0.93 mg/dL (ref 0.40–1.20)
GFR: 65.03 mL/min (ref >60.00)
Glucose, Bld: 76 mg/dL (ref 70–99)
Potassium: 4.2 meq/L (ref 3.5–5.1)
Sodium: 140 meq/L (ref 135–145)

## 2023-11-26 ENCOUNTER — Ambulatory Visit: Payer: Self-pay | Admitting: Student in an Organized Health Care Education/Training Program

## 2023-12-20 ENCOUNTER — Ambulatory Visit: Admitting: Student in an Organized Health Care Education/Training Program

## 2023-12-20 VITALS — BP 194/106 | HR 91 | Wt 201.0 lb

## 2023-12-20 DIAGNOSIS — M48061 Spinal stenosis, lumbar region without neurogenic claudication: Secondary | ICD-10-CM

## 2023-12-20 DIAGNOSIS — I10 Essential (primary) hypertension: Secondary | ICD-10-CM

## 2023-12-20 DIAGNOSIS — F411 Generalized anxiety disorder: Secondary | ICD-10-CM | POA: Insufficient documentation

## 2023-12-20 DIAGNOSIS — K760 Fatty (change of) liver, not elsewhere classified: Secondary | ICD-10-CM | POA: Insufficient documentation

## 2023-12-20 MED ORDER — LOSARTAN POTASSIUM-HCTZ 50-12.5 MG PO TABS: 1.0000 | ORAL_TABLET | Freq: Every day | ORAL | 2 refills | Status: DC

## 2023-12-20 MED ORDER — ESCITALOPRAM OXALATE 10 MG PO TABS: 10.0000 mg | ORAL_TABLET | Freq: Every day | ORAL | 2 refills | Status: AC

## 2023-12-20 MED ORDER — DIAZEPAM 5 MG PO TABS: 5.0000 mg | ORAL_TABLET | Freq: Every day | ORAL | 0 refills | Status: DC | PRN

## 2023-12-20 MED ORDER — TRAMADOL HCL 50 MG PO TABS: 50.0000 mg | ORAL_TABLET | Freq: Every day | ORAL | 0 refills | Status: DC | PRN

## 2023-12-20 NOTE — Assessment & Plan Note (Signed)
 Chronic and stable.  Doing well with tramadol  once daily.  No adverse side effects.  Reports good benefit and functional status.  I reviewed the controlled database which was appropriate.  Last urine drug test was appropriate.  Refilled another 1 month of tramadol .

## 2023-12-20 NOTE — Assessment & Plan Note (Signed)
 She experiences significant stress and anxiety, previously managed with escitalopram, and is currently using diazepam  and tramadol . Restarted escitalopram. Continue diazepam . Follow-up in one month to assess mood and anxiety.

## 2023-12-20 NOTE — Assessment & Plan Note (Signed)
 Chronic severe asymptomatic hypertension.  Blood pressure is near 200 mmHg, indicating losartan  is insufficient and posing a risk for stroke or bleeding.  Part of the issue is medication adherence.  Switched to combination Hyzaar once daily. Follow-up in one month for blood pressure reassessment and blood work review.

## 2023-12-20 NOTE — Assessment & Plan Note (Signed)
 Imaging shows liver irregularity, likely fatty liver disease, with no malignancy concerns. Continue dietary modifications.

## 2023-12-20 NOTE — Progress Notes (Signed)
 Established Patient Office Visit  Patient ID: Megan Maldonado, female    DOB: Feb 16, 1960  Age: 63 y.o. MRN: [id number] PCP: Jerrell Cleatus Ned, MD  Chief Complaint  Patient presents with   Hypertension    4 week follow up  Insurance calls patient to complete medicare annual wellness visit Patient will schedule Annual eye exam      Subjective:     HPI  Discussed the use of AI scribe software for clinical note transcription with the patient, who gave verbal consent to proceed.  History of Present Illness Megan Maldonado is a 63 year old female who presents with concerns about medication side effects and a liver irregularity.  She has been off Crestor  for about four months due to severe vertigo that began after her dosage was increased, making it difficult for her to stand without support. She was switched to another cholesterol medication, which also caused severe vertigo, leading her to discontinue it immediately. She has resumed Crestor  at a lower frequency, taking it three times a week, and is considering increasing the frequency to four times a week. She is also working on her diet and has experienced some weight fluctuations, noting a weight of 199 pounds before Thanksgiving.  She is concerned about a shadow on her liver noted during an imaging study after her neck surgery. The imaging was initially for a lung cancer screening, which noted an irregularity on the liver. She has a family history of pancreatic cancer in her maternal grandmother, which adds to her concern. She has been working on her diet to address fatty liver issues.  She did not take her blood pressure medication last night due to forgetting after a light supper. She is currently on losartan  at the maximum dose and is open to medication adjustments to better control her high blood pressure. She has a family history of reactions to ACE inhibitors and is cautious about medication changes.  She is experiencing  significant stress due to family circumstances, including her stepsister's husband's cancer treatment and her responsibilities in caring for her mother and a friend's mother. She has a history of using Lexapro for anxiety following multiple personal losses and found it helpful in the past. She is currently using Valium  and tramadol , which she finds effective for managing her symptoms, particularly when assisting with physical tasks that strain her back.     Objective:     BP (!) 194/106   Pulse 91   Wt 201 lb (91.2 kg)   BMI 37.98 kg/m   Physical Exam  Gen: Well-appearing woman Heart: 2 out of 6 early systolic murmur best heard at the right upper sternal border, regular rhythm Lungs: Unlabored, clear throughout Ext: Warm, no edema Psych: Moderately depressed appearing    Assessment & Plan:   Problem List Items Addressed This Visit       High   Spinal stenosis of lumbar region (Chronic)   Chronic and stable.  Doing well with tramadol  once daily.  No adverse side effects.  Reports good benefit and functional status.  I reviewed the controlled database which was appropriate.  Last urine drug test was appropriate.  Refilled another 1 month of tramadol .      Relevant Medications   traMADol  (ULTRAM ) 50 MG tablet   diazepam  (VALIUM ) 5 MG tablet   Hypertension - Primary (Chronic)   Chronic severe asymptomatic hypertension.  Blood pressure is near 200 mmHg, indicating losartan  is insufficient and posing a risk for stroke or  bleeding.  Part of the issue is medication adherence.  Switched to combination Hyzaar once daily. Follow-up in one month for blood pressure reassessment and blood work review.      Relevant Medications   losartan -hydrochlorothiazide (HYZAAR) 50-12.5 MG tablet   Generalized anxiety disorder (Chronic)   She experiences significant stress and anxiety, previously managed with escitalopram, and is currently using diazepam  and tramadol . Restarted escitalopram. Continue  diazepam . Follow-up in one month to assess mood and anxiety.      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 Continue dietary modifications.       Return in about 4 weeks (around 01/17/2024) for HTN management.    Cleatus Debby Specking, MD [facility]

## 2023-12-20 NOTE — Patient Instructions (Signed)
  VISIT SUMMARY: During your visit, we discussed your concerns about medication side effects, a liver irregularity, and your current health conditions. We reviewed your medications, made some adjustments, and discussed your ongoing health management strategies.  YOUR PLAN: -HYPERTENSION, UNCONTROLLED: Your blood pressure is very high, which can increase the risk of stroke or bleeding. We have switched your medication to Hyzaar, which you should take once daily. We will reassess your blood pressure and review your blood work in one month.  -HYPERLIPIDEMIA, STATIN INTOLERANCE: You have high cholesterol and have experienced vertigo with higher doses of rosuvastatin . Continue taking rosuvastatin  once a week and focus on dietary changes to help manage your cholesterol levels.  -FATTY LIVER DISEASE: Your imaging study showed an irregularity in your liver, likely due to fatty liver disease, but there are no concerns about cancer. Continue with your dietary modifications to help manage this condition.  -GENERALIZED ANXIETY DISORDER: You are experiencing significant stress and anxiety. We have restarted escitalopram (Lexapro) to help manage your anxiety. Continue using diazepam  and tramadol  as needed. We will follow up in one month to see how you are feeling.  INSTRUCTIONS: Please follow up in one month for a blood pressure reassessment, blood work review, and to discuss your mood and anxiety levels.

## 2024-01-05 ENCOUNTER — Ambulatory Visit: Payer: Self-pay

## 2024-01-05 ENCOUNTER — Emergency Department (HOSPITAL_COMMUNITY)
Admission: EM | Admit: 2024-01-05 | Discharge: 2024-01-05 | Disposition: A | Discharge status: Home / Self Care | Attending: Emergency Medicine | Admitting: Emergency Medicine

## 2024-01-05 ENCOUNTER — Encounter (HOSPITAL_COMMUNITY): Payer: Self-pay

## 2024-01-05 ENCOUNTER — Other Ambulatory Visit: Payer: Self-pay

## 2024-01-05 DIAGNOSIS — R059 Cough, unspecified: Secondary | ICD-10-CM | POA: Diagnosis present

## 2024-01-05 DIAGNOSIS — J111 Influenza due to unidentified influenza virus with other respiratory manifestations: Secondary | ICD-10-CM | POA: Diagnosis not present

## 2024-01-05 LAB — COMPREHENSIVE METABOLIC PANEL WITH GFR
ALT: 14 U/L (ref 0–44)
AST: 28 U/L (ref 15–41)
Albumin: 4.4 g/dL (ref 3.5–5.0)
Alkaline Phosphatase: 135 U/L — ABNORMAL HIGH (ref 38–126)
Anion gap: 15 (ref 5–15)
BUN: 18 mg/dL (ref 8–23)
CO2: 24 mmol/L (ref 22–32)
Calcium: 10.2 mg/dL (ref 8.9–10.3)
Chloride: 98 mmol/L (ref 98–111)
Creatinine, Ser: 0.91 mg/dL (ref 0.44–1.00)
GFR, Estimated: >60 mL/min (ref >60)
Glucose, Bld: 154 mg/dL — ABNORMAL HIGH (ref 70–99)
Potassium: 3.7 mmol/L (ref 3.5–5.1)
Sodium: 138 mmol/L (ref 135–145)
Total Bilirubin: 0.4 mg/dL (ref 0.0–1.2)
Total Protein: 7.4 g/dL (ref 6.5–8.1)

## 2024-01-05 LAB — CBC
HCT: 39.7 % (ref 36.0–46.0)
Hemoglobin: 13.9 g/dL (ref 12.0–15.0)
MCH: 33.3 pg (ref 26.0–34.0)
MCHC: 35 g/dL (ref 30.0–36.0)
MCV: 95.2 fL (ref 80.0–100.0)
Platelets: 202 K/uL (ref 150–400)
RBC: 4.17 MIL/uL (ref 3.87–5.11)
RDW: 12.3 % (ref 11.5–15.5)
WBC: 7.6 K/uL (ref 4.0–10.5)
nRBC: 0 % (ref 0.0–0.2)

## 2024-01-05 LAB — LIPASE, BLOOD: Lipase: 19 U/L (ref 11–51)

## 2024-01-05 LAB — RESP PANEL BY RT-PCR (RSV, FLU A&B, COVID)  RVPGX2
Influenza A by PCR: POSITIVE — AB
Influenza B by PCR: NEGATIVE
Resp Syncytial Virus by PCR: NEGATIVE
SARS Coronavirus 2 by RT PCR: NEGATIVE

## 2024-01-05 MED ORDER — ONDANSETRON 4 MG PO TBDP: 4.0000 mg | ORAL_TABLET | Freq: Three times a day (TID) | ORAL | 0 refills | Status: AC | PRN

## 2024-01-05 MED ORDER — SODIUM CHLORIDE 0.9 % IV BOLUS
1000.0000 mL | Freq: Once | INTRAVENOUS | Status: AC
  Administered 2024-01-05: 13:00:00 1000 mL via INTRAVENOUS

## 2024-01-05 NOTE — ED Notes (Signed)
 Pt aware we need urine sample, states she cannot go at this time. RN aware

## 2024-01-05 NOTE — ED Provider Notes (Signed)
 [place] EMERGENCY DEPARTMENT AT [facility] Provider Note   CSN: [id number] Arrival date & time: 01/05/24  1105     Patient presents with: Emesis   Megan Maldonado is a 63 y.o. female.   HPI Patient with nausea, vomiting, diarrhea, cough x 4 days.  She was well prior to the onset.  Today, with worsening symptoms she called her primary care physician and was sent to the emergency department for evaluation.     Prior to Admission medications  Medication Sig Start Date End Date Taking? Authorizing Provider  ondansetron  (ZOFRAN -ODT) 4 MG disintegrating tablet Take 1 tablet (4 mg total) by mouth every 8 (eight) hours as needed for nausea or vomiting. 01/05/24  Yes Garrick Charleston, MD  diazepam  (VALIUM ) 5 MG tablet Take 1 tablet (5 mg total) by mouth daily as needed for anxiety. 12/20/23   Jerrell Cleatus Ned, MD  escitalopram  (LEXAPRO ) 10 MG tablet Take 1 tablet (10 mg total) by mouth daily. 12/20/23   Jerrell Cleatus Ned, MD  losartan -hydrochlorothiazide (HYZAAR) 50-12.5 MG tablet Take 1 tablet by mouth daily. 12/20/23   Jerrell Cleatus Ned, MD  meloxicam  (MOBIC ) 15 MG tablet Take 1 tablet (15 mg total) by mouth daily as needed for pain. 11/24/23   Jerrell Cleatus Ned, MD  omeprazole (PRILOSEC) 20 MG capsule Take 20 mg by mouth daily before breakfast.    [provider]  rosuvastatin  (CRESTOR ) 20 MG tablet Take 20 mg by mouth daily. Patient taking differently: Take 20 mg by mouth daily. Patient states taking this medication 3x per week 09/13/23   [provider]  traMADol  (ULTRAM ) 50 MG tablet Take 1 tablet (50 mg total) by mouth daily as needed for moderate pain (pain score 4-6). 12/20/23   Jerrell Cleatus Ned, MD    Allergies: Ace inhibitors and Gluten meal    Review of Systems  Updated Vital Signs BP (!) 145/79 (BP Location: Right Arm)   Pulse (!) 105   Temp 98.4 F (36.9 C) (Oral)   Resp 19   Ht 1.549 m (5' 1)   Wt 91.2 kg   SpO2  93%   BMI 37.98 kg/m   Physical Exam Vitals and nursing note reviewed.  Constitutional:      General: She is not in acute distress.    Appearance: She is well-developed.  HENT:     Head: Normocephalic and atraumatic.  Eyes:     Conjunctiva/sclera: Conjunctivae normal.  Cardiovascular:     Rate and Rhythm: Regular rhythm. Tachycardia present.  Pulmonary:     Effort: Pulmonary effort is normal. No respiratory distress.     Breath sounds: No stridor.  Abdominal:     General: There is no distension.     Tenderness: There is no abdominal tenderness. There is no guarding.  Skin:    General: Skin is warm and dry.  Neurological:     Mental Status: She is alert and oriented to person, place, and time.     Cranial Nerves: No cranial nerve deficit.  Psychiatric:        Mood and Affect: Mood normal.     (all labs ordered are listed, but only abnormal results are displayed) Labs Reviewed  RESP PANEL BY RT-PCR (RSV, FLU A&B, COVID)  RVPGX2 - Abnormal; Notable for the following components:      Result Value   Influenza A by PCR POSITIVE (*)    All other components within normal limits  COMPREHENSIVE METABOLIC PANEL WITH GFR - Abnormal;  Notable for the following components:   Glucose, Bld 154 (*)    Alkaline Phosphatase 135 (*)    All other components within normal limits  LIPASE, BLOOD  CBC  URINALYSIS, ROUTINE W REFLEX MICROSCOPIC    EKG: None  Radiology: No results found.   Procedures   Medications Ordered in the ED  sodium chloride  0.9 % bolus 1,000 mL (has no administration in time range)                                    Medical Decision Making Elderly female with nausea, vomiting, diarrhea, cough for 4 days.  She is mildly tachycardic suggesting possible dehydration versus compensation for likely viral process.  No hypoxia or hypotension suggesting sepsis.  Labs viral panel sent. Labs noncontributory, viral panel positive for flu.  Given the passage of at least  4 days since onset of symptoms, she is not a candidate for Tamiflu.  Patient received IV fluids here had improved tachycardia, was discharged in stable condition.  Amount and/or Complexity of Data Reviewed Labs: ordered. Decision-making details documented in ED Course.  Risk Decision regarding hospitalization.     Final diagnoses:  Influenza    ED Discharge Orders          Ordered    ondansetron  (ZOFRAN -ODT) 4 MG disintegrating tablet  Every 8 hours PRN        01/05/24 1237               Garrick Charleston, MD 01/05/24 1237

## 2024-01-05 NOTE — ED Triage Notes (Signed)
 Pt arrived via POV c/o N/V/D and a cough and headache since this weekend.

## 2024-01-05 NOTE — Discharge Instructions (Signed)
 You have been diagnosed with the flu.  Typically this takes 7 to 10 days for recovery.  Stay well-hydrated, monitor your condition carefully and return here for concerning changes in your condition.

## 2024-01-05 NOTE — Telephone Encounter (Signed)
° °  FYI Only or Action Required?: FYI only for provider: ED advised.  Patient was last seen in primary care on 12/20/2023 by Jerrell Cleatus Ned, MD.  Called Nurse Triage reporting Emesis.  Symptoms began several days ago.  Interventions attempted: Nothing.  Symptoms are: gradually worsening.  Triage Disposition: Go to ED Now (or PCP Triage)  Patient/caregiver understands and will follow disposition?: Yes  Message from [facility] sent at 01/05/2024  7:57 AM EST  Summary: Vomiting   Reason for Triage: Patient is having uncontrolled vomiting, even while on with agent. Cold symptoms as well. Blowing nose frequently      Reason for Disposition  [1] MODERATE to SEVERE vomiting (e.g., 3 or more times/day) AND [2] age > 60 years  Answer Assessment - Initial Assessment Questions 1. VOMITING SEVERITY: How many times have you vomited in the past 24 hours?  12 times yesterday and twice today  2. ONSET: When did the vomiting begin?  Sunday - 3 days       3. FLUIDS: What fluids or food have you vomited up today? Have you been able to keep any fluids down? Pt states she has been drinking gatorade      4. ABDOMEN PAIN: Are your having any abdomen pain? If Yes : How bad is it and what does it feel like? (e.g., crampy, dull, intermittent, constant)       denies  5. DIARRHEA: Is there any diarrhea? If Yes, ask: How many times today?  Pt reports diarrhea. 4 times X 24 hours      6. CONTACTS: Is there anyone else in the family with the same symptoms?       Pt denies  7. CAUSE: What do you think is causing your vomiting?      Pt denies knowing cause  8. HYDRATION STATUS:  Pt denies dizziness, too weak to stand, decreased urination. Pt reports dry mouth        9. OTHER SYMPTOMS: Pt denies fever, vertigo, vomiting blood or coffee grounds, recent head injury   Pt reports chest discomfort and headache rating 4/10  Pt reports cough, diarrhea, and vomiting onset  3 days Pt sent to ED for further evaluation and treatment based upon the severity of the vomiting. Pt agrees with plan of care, will call back for any worsening symptoms  Protocols used: Vomiting-A-AH

## 2024-01-06 NOTE — Telephone Encounter (Signed)
 Patient did go to the emergency department last night, diagnosed with influenza and started treatment.

## 2024-01-17 ENCOUNTER — Other Ambulatory Visit: Payer: Self-pay | Admitting: Student in an Organized Health Care Education/Training Program

## 2024-01-17 ENCOUNTER — Encounter: Payer: Self-pay | Admitting: Student in an Organized Health Care Education/Training Program

## 2024-01-17 ENCOUNTER — Ambulatory Visit: Admitting: Student in an Organized Health Care Education/Training Program

## 2024-01-17 VITALS — BP 154/94 | HR 95 | Wt 190.0 lb

## 2024-01-17 DIAGNOSIS — I10 Essential (primary) hypertension: Secondary | ICD-10-CM | POA: Diagnosis not present

## 2024-01-17 DIAGNOSIS — M48061 Spinal stenosis, lumbar region without neurogenic claudication: Secondary | ICD-10-CM | POA: Diagnosis not present

## 2024-01-17 MED ORDER — DIAZEPAM 5 MG PO TABS: 5.0000 mg | ORAL_TABLET | Freq: Every day | ORAL | 0 refills | Status: AC | PRN

## 2024-01-17 MED ORDER — TRAMADOL HCL 50 MG PO TABS: 50.0000 mg | ORAL_TABLET | Freq: Every day | ORAL | 0 refills | Status: DC | PRN

## 2024-01-17 MED ORDER — LOSARTAN POTASSIUM-HCTZ 100-25 MG PO TABS: 1.0000 | ORAL_TABLET | Freq: Every day | ORAL | 3 refills | Status: AC

## 2024-01-17 NOTE — Patient Instructions (Signed)
" °  VISIT SUMMARY: You came in today for a follow-up on your blood pressure management. You recently recovered from a flu-like illness, and your appetite has not fully returned to normal. We discussed your current medications and their effects, and reviewed your home blood pressure readings.  YOUR PLAN: -PRIMARY HYPERTENSION: Primary hypertension means high blood pressure without a known secondary cause. Your blood pressure has improved with your current medication, Hyzaar, but your office readings are still a bit high. We have increased your Hyzaar dose until your next refill. Please continue to monitor your blood pressure at home twice a week and bring your readings to your next appointment. We will follow up in 4 weeks to reassess your blood pressure.  -LUMBAR SPINAL STENOSIS: Lumbar spinal stenosis is a narrowing of the spaces within your spine, which can put pressure on the nerves. Your symptoms have improved with tramadol  and diazepam , so you should continue your current regimen. Refills for tramadol  and diazepam  have been sent to your pharmacy.  -GENERAL HEALTH MAINTENANCE: We reviewed your routine labs and they are normal. Continue with your current lifestyle and medication regimen. No changes were made today.  INSTRUCTIONS: Please continue to monitor your blood pressure at home twice a week and bring your readings to your next appointment. Follow up in 4 weeks to reassess your blood pressure.    "

## 2024-01-17 NOTE — Assessment & Plan Note (Addendum)
 Blood pressure has improved with medication but still not at goal. Home readings are in the 130s/80s, while office readings are 154/94. She experiences no lightheadedness except during a recent illness. Increased Hyzaar (losartan -hydrochlorothiazide) dose until the next refill. Continue home blood pressure monitoring twice weekly and bring readings to the next appointment. Follow-up in 4 weeks to reassess blood pressure.

## 2024-01-17 NOTE — Assessment & Plan Note (Signed)
 Symptoms have improved with tramadol  and diazepam , maintaining functionality.  We were able to successfully transition away from oxycodone  to tramadol .  She reports good functional benefit with this regimen and no side effects.  I reviewed the database which was appropriate.  Continue the current regimen. One month refills for tramadol  and diazepam  have been sent.

## 2024-01-17 NOTE — Progress Notes (Signed)
" ° °  Established Patient Office Visit  Patient ID: Megan Maldonado, female    DOB: May 09, 1960  Age: 63 y.o. MRN: [id number] PCP: Jerrell Cleatus Ned, MD  Chief Complaint  Patient presents with   Hypertension    4 week follow up     Subjective:     HPI  Discussed the use of AI scribe software for clinical note transcription with the patient, who gave verbal consent to proceed.  History of Present Illness Megan Maldonado is a 63 year old female with hypertension who presents for follow-up on blood pressure management.  Two weeks ago, she experienced a flu-like illness lasting one week. During this time, she could only tolerate Gatorade. Although she has recovered, her appetite has not fully returned to normal.  She is currently taking Hyzaar once daily for hypertension. She monitors her blood pressure at home, with readings typically in the 130s to 140s over 80s. Previously, her blood pressure was significantly higher at 196/100. She did not bring her recorded blood pressure readings to the visit.  She takes rosuvastatin  three times a week due to previous issues with vertigo when the dose was increased and taken daily. She is on tramadol  and Valium  for muscle tightness, which she takes as needed, sometimes starting with a half dose.  She takes meloxicam  daily for joint pain, particularly in her hips, knees, and ankle. Attempts to reduce the frequency result in increased pain, making it difficult to stand or lay on one side. No current lightheadedness, though she experienced it while sick with the flu.     Objective:     BP (!) 154/94   Pulse 95   Wt 190 lb (86.2 kg)   BMI 35.90 kg/m   Physical Exam  Gen: Moderately frail appearing woman Neck: Normal thyroid , no nodules or adenopathy Heart: Regular, no murmur Lungs: Unlabored, clear throughout Ext: Warm, no edema    Assessment & Plan:   Problem List Items Addressed This Visit       High   Spinal stenosis of lumbar  region (Chronic)   Symptoms have improved with tramadol  and diazepam , maintaining functionality.  We were able to successfully transition away from oxycodone  to tramadol .  She reports good functional benefit with this regimen and no side effects.  I reviewed the database which was appropriate.  Continue the current regimen. One month refills for tramadol  and diazepam  have been sent.      Relevant Medications   diazepam  (VALIUM ) 5 MG tablet   traMADol  (ULTRAM ) 50 MG tablet   Hypertension - Primary (Chronic)   Blood pressure has improved with medication but still not at goal. Home readings are in the 130s/80s, while office readings are 154/94. She experiences no lightheadedness except during a recent illness. Increased Hyzaar (losartan -hydrochlorothiazide) dose until the next refill. Continue home blood pressure monitoring twice weekly and bring readings to the next appointment. Follow-up in 4 weeks to reassess blood pressure.      Relevant Medications   losartan -hydrochlorothiazide (HYZAAR) 100-25 MG tablet    Return in about 4 weeks (around 02/14/2024) for HTN management.    Cleatus Ned Jerrell, MD  [facility]   "

## 2024-02-11 ENCOUNTER — Other Ambulatory Visit: Payer: Self-pay | Admitting: Student in an Organized Health Care Education/Training Program

## 2024-02-11 DIAGNOSIS — M48061 Spinal stenosis, lumbar region without neurogenic claudication: Secondary | ICD-10-CM

## 2024-02-14 ENCOUNTER — Telehealth: Admitting: Student in an Organized Health Care Education/Training Program

## 2024-02-14 ENCOUNTER — Encounter: Payer: Self-pay | Admitting: Student in an Organized Health Care Education/Training Program

## 2024-02-14 VITALS — BP 121/61

## 2024-02-14 DIAGNOSIS — I10 Essential (primary) hypertension: Secondary | ICD-10-CM

## 2024-02-14 DIAGNOSIS — F411 Generalized anxiety disorder: Secondary | ICD-10-CM | POA: Diagnosis not present

## 2024-02-14 DIAGNOSIS — M48061 Spinal stenosis, lumbar region without neurogenic claudication: Secondary | ICD-10-CM | POA: Diagnosis not present

## 2024-02-14 MED ORDER — MELOXICAM 15 MG PO TABS: 15.0000 mg | ORAL_TABLET | Freq: Every day | ORAL | 2 refills | Status: AC | PRN

## 2024-02-14 MED ORDER — TRAMADOL HCL 50 MG PO TABS: 50.0000 mg | ORAL_TABLET | Freq: Every day | ORAL | 0 refills | Status: AC | PRN

## 2024-02-14 NOTE — Assessment & Plan Note (Signed)
 Chronic and stable.  Improved blood pressure readings at home since we increased the Hyzaar to full dose.  Using it at nighttime, having some nocturia.  Probably multifactorial, seems to be drinking lots of water and Gatorade.  I recommended switching the Hyzaar to morning time dosing.  Had 1 possible low blood pressure, but no symptoms, so I suspect it may have been a false reading.  The rest of her blood pressures have all been normal and she is having no symptoms of orthostatic hypotension.  Will check a BMP, future order placed, would like to get that sometime in the next 1-2 weeks.

## 2024-02-14 NOTE — Assessment & Plan Note (Signed)
 Chronic and stable.  He has diffuse osteoarthritis but a lot of symptoms located to the lumbar region.  Has cut back on meloxicam  because we discussed the risk of side effects including kidney injury and peptic ulcer disease.  She has noticed a decline in functioning without the meloxicam .  Currently using it about 3 days a week and wants to increase it to 4 days a week.  I think this is probably pretty low risk.  I recommended continuing to use omeprazole for protection of peptic ulcer disease.  Kidney function has been normal.  Will recheck a BMP because we increased the dose of Hyzaar.  I refilled tramadol  today.  I reviewed the database which was appropriate.  Not having side effects.  Benefits seem to outweigh the risks.

## 2024-02-14 NOTE — Assessment & Plan Note (Signed)
 Chronic and stable.  Anxiety is well-controlled currently.  Using Lexapro  and Valium  on a near daily basis.  I reviewed the database which was appropriate.  She generally takes the Valium  as a half a tablet, breaks it up to twice daily dosing.  Finds it effective and helpful.  No side effects.

## 2024-02-14 NOTE — Progress Notes (Signed)
 "                    MyChart Video Visit    Virtual Visit via Video Note   This format is felt to be most appropriate for this patient at this time. Physical exam was limited by quality of the video and audio technology used for the visit.    Patient location: home Provider location: [facility] Persons involved in the visit: patient, provider  I discussed the limitations of evaluation and management by telemedicine and the availability of in person appointments. The patient expressed understanding and agreed to proceed.  Patient: Megan Maldonado   DOB: [birth year]   64 y.o. Female  MRN: [id number] Visit Date: 02/14/2024  Today's healthcare provider: Cleatus Debby Specking, MD   Chief Complaint  Patient presents with   Follow-up    4 week follow up. Patient has been checking BP. 02/12/24 patient woke up with a nose bleed. BP 68/48. Other than that BP has been normal    Subjective:    HPI  64 year old person with a history of hypertension here for 4-week follow-up after we adjusted the dosage of the Hyzaar.  Doing pretty well.  She walked me through her blood pressure readings at home which have all been pretty normal.  She had 1 outlier blood pressure low that read at 68/48, but she had no symptoms at the time of lightheadedness or dizziness.  Rest her blood pressures have been in a good range in the systolic 120s to 869d.  No falls, no lightheadedness, no dizziness.  She is having some increased nocturia.  Had the flu in December, afterwards she has been drinking more Gatorade to try to stay hydrated.  Currently taking the Hyzaar at night.  Reports increasing back pain and pain in her knees.  Using meloxicam  about 3 days a week, trying to cut back as we have increased the dose of the losartan .  Using tramadol  about 1 day/week.  Having some struggles doing her housework at this point because of stiffness in her back and her legs.  Feeling some  discomfort in her anterior hips.  No injuries.  Pretty good support at home.  Anxiety is doing well.  Currently using Lexapro  and Valium  for a long time.  No side effects to the Valium .    Objective:    BP 121/61   Physical Exam   Gen: Comfortable appearing woman Lungs: Unlabored, normal work of breathing Neuro: Alert, conversational, not sedated, full strength upper extremities Psych: Appropriate mood and affect, not anxious or depressed appearing     Assessment & Plan:    Problem List Items Addressed This Visit       High   Spinal stenosis of lumbar region - Primary (Chronic)   Chronic and stable.  He has diffuse osteoarthritis but a lot of symptoms located to the lumbar region.  Has cut back on meloxicam  because we discussed the risk of side effects including kidney injury and peptic ulcer disease.  She has noticed a decline in functioning without the meloxicam .  Currently using it about 3 days a week and wants to increase it to 4 days a week.  I think this is probably pretty low risk.  I recommended continuing to use omeprazole for protection of peptic ulcer disease.  Kidney function has been normal.  Will recheck a BMP because we increased the dose of Hyzaar.  I refilled tramadol  today.  I reviewed  the database which was appropriate.  Not having side effects.  Benefits seem to outweigh the risks.      Relevant Medications   traMADol  (ULTRAM ) 50 MG tablet   meloxicam  (MOBIC ) 15 MG tablet   Hypertension (Chronic)   Chronic and stable.  Improved blood pressure readings at home since we increased the Hyzaar to full dose.  Using it at nighttime, having some nocturia.  Probably multifactorial, seems to be drinking lots of water and Gatorade.  I recommended switching the Hyzaar to morning time dosing.  Had 1 possible low blood pressure, but no symptoms, so I suspect it may have been a false reading.  The rest of her blood pressures have all been normal and she is having no symptoms of  orthostatic hypotension.  Will check a BMP, future order placed, would like to get that sometime in the next 1-2 weeks.      Relevant Orders   Basic metabolic panel with GFR   Generalized anxiety disorder (Chronic)   Chronic and stable.  Anxiety is well-controlled currently.  Using Lexapro  and Valium  on a near daily basis.  I reviewed the database which was appropriate.  She generally takes the Valium  as a half a tablet, breaks it up to twice daily dosing.  Finds it effective and helpful.  No side effects.        Meds ordered this encounter  Medications   traMADol  (ULTRAM ) 50 MG tablet    Sig: Take 1 tablet (50 mg total) by mouth daily as needed for moderate pain (pain score 4-6).    Dispense:  30 tablet    Refill:  0   meloxicam  (MOBIC ) 15 MG tablet    Sig: Take 1 tablet (15 mg total) by mouth daily as needed for pain.    Dispense:  30 tablet    Refill:  2        I discussed the assessment and treatment plan with the patient. The patient was provided an opportunity to ask questions and all were answered. The patient agreed with the plan and demonstrated an understanding of the instructions.   The patient was advised to call back or seek an in-person evaluation if the symptoms worsen or if the condition fails to improve as anticipated.  I provided 14 minutes of non-face-to-face time during this encounter.  Cleatus Debby Specking, MD [facility]    "

## 2024-02-18 ENCOUNTER — Telehealth: Payer: Self-pay | Admitting: Student in an Organized Health Care Education/Training Program

## 2024-02-18 NOTE — Telephone Encounter (Signed)
 No Cleatus Specking, MD required for this form.  Thank you.

## 2024-02-18 NOTE — Telephone Encounter (Signed)
 noted

## 2024-02-18 NOTE — Telephone Encounter (Signed)
Placed on PCP desk for signature. 

## 2024-02-21 ENCOUNTER — Other Ambulatory Visit

## 2024-02-28 ENCOUNTER — Other Ambulatory Visit

## 2024-03-06 ENCOUNTER — Ambulatory Visit: Admitting: Family Medicine
